# Patient Record
Sex: Male | Born: 1990 | Race: Black or African American | Hispanic: No | Marital: Single | Smoking: Never smoker
Health system: Southern US, Community
[De-identification: ages and names within clinical notes are randomized; demographics above are authoritative.]

## PROBLEM LIST (undated history)

## (undated) DIAGNOSIS — I10 Essential (primary) hypertension: Secondary | ICD-10-CM

## (undated) DIAGNOSIS — E119 Type 2 diabetes mellitus without complications: Secondary | ICD-10-CM

## (undated) DIAGNOSIS — Z21 Asymptomatic human immunodeficiency virus [HIV] infection status: Secondary | ICD-10-CM

## (undated) DIAGNOSIS — B20 Human immunodeficiency virus [HIV] disease: Secondary | ICD-10-CM

---

## 2011-06-25 ENCOUNTER — Encounter (HOSPITAL_COMMUNITY): Payer: Self-pay | Admitting: *Deleted

## 2011-06-25 ENCOUNTER — Emergency Department (HOSPITAL_COMMUNITY): Payer: Medicaid Other

## 2011-06-25 ENCOUNTER — Emergency Department (HOSPITAL_COMMUNITY)
Admission: EM | Admit: 2011-06-25 | Discharge: 2011-06-25 | Disposition: A | Discharge status: Home / Self Care | Payer: Medicaid Other | Attending: Emergency Medicine | Admitting: Emergency Medicine

## 2011-06-25 DIAGNOSIS — Y92009 Unspecified place in unspecified non-institutional (private) residence as the place of occurrence of the external cause: Secondary | ICD-10-CM | POA: Insufficient documentation

## 2011-06-25 DIAGNOSIS — S01511A Laceration without foreign body of lip, initial encounter: Secondary | ICD-10-CM

## 2011-06-25 DIAGNOSIS — S025XXA Fracture of tooth (traumatic), initial encounter for closed fracture: Secondary | ICD-10-CM | POA: Insufficient documentation

## 2011-06-25 DIAGNOSIS — S032XXA Dislocation of tooth, initial encounter: Secondary | ICD-10-CM

## 2011-06-25 DIAGNOSIS — S01501A Unspecified open wound of lip, initial encounter: Secondary | ICD-10-CM | POA: Insufficient documentation

## 2011-06-25 MED ORDER — IBUPROFEN 800 MG PO TABS: 800.0000 mg | ORAL_TABLET | Freq: Three times a day (TID) | ORAL | Status: AC | PRN

## 2011-06-25 MED ORDER — HYDROCODONE-ACETAMINOPHEN 5-325 MG PO TABS: 1.0000 | ORAL_TABLET | Freq: Four times a day (QID) | ORAL | Status: AC | PRN
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
## 2011-06-25 NOTE — Discharge Instructions (Signed)
Follow up with the dentist provided. Return here as needed. Keep wounds clean and dry. Have sutures out in 5 days.

## 2011-06-25 NOTE — ED Notes (Signed)
Suture cart placed outside the room.
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
## 2011-06-28 NOTE — ED Provider Notes (Signed)
Medical screening examination/treatment/procedure(s) were performed by non-physician practitioner and as supervising physician I was immediately available for consultation/collaboration.   Azana Kiesler E Meadow Abramo, MD 06/28/11 1016 

## 2012-06-19 ENCOUNTER — Emergency Department (HOSPITAL_COMMUNITY)
Admission: EM | Admit: 2012-06-19 | Discharge: 2012-06-19 | Disposition: A | Discharge status: Home / Self Care | Payer: BC Managed Care – PPO | Attending: Emergency Medicine | Admitting: Emergency Medicine

## 2012-06-19 ENCOUNTER — Encounter (HOSPITAL_COMMUNITY): Payer: Self-pay | Admitting: *Deleted

## 2012-06-19 DIAGNOSIS — R059 Cough, unspecified: Secondary | ICD-10-CM | POA: Insufficient documentation

## 2012-06-19 DIAGNOSIS — J3489 Other specified disorders of nose and nasal sinuses: Secondary | ICD-10-CM | POA: Insufficient documentation

## 2012-06-19 DIAGNOSIS — R1110 Vomiting, unspecified: Secondary | ICD-10-CM | POA: Insufficient documentation

## 2012-06-19 DIAGNOSIS — R05 Cough: Secondary | ICD-10-CM | POA: Insufficient documentation

## 2012-06-19 DIAGNOSIS — F17200 Nicotine dependence, unspecified, uncomplicated: Secondary | ICD-10-CM | POA: Insufficient documentation

## 2012-06-19 MED ORDER — BENZONATATE 100 MG PO CAPS: 200.0000 mg | ORAL_CAPSULE | Freq: Two times a day (BID) | ORAL | Status: DC | PRN

## 2012-06-19 MED ORDER — FLUTICASONE PROPIONATE 50 MCG/ACT NA SUSP: 2.0000 | Freq: Every day | NASAL | Status: DC

## 2012-06-19 NOTE — ED Notes (Signed)
Pt c/o cough and congestion since Tuesday. Pt states he cough so hard today which made him gag and throw up. Pt took Mucinex with some relief. Respirations equal and unlabored, skin warm and dry, A&Ox4

## 2012-06-19 NOTE — ED Provider Notes (Signed)
History    This chart was scribed for Jimmy Silk, PA working with Vida Roller, MD by ED Scribe, Burman Nieves. This patient was seen in room WTR8/WTR8 and the patient's care was started at 11:29 PM.   CSN: [id number]  Arrival date & time 06/19/12  2315   First MD Initiated Contact with Patient 06/19/12 2329      Chief Complaint  Patient presents with  . Cough    (Consider location/radiation/quality/duration/timing/severity/associated sxs/prior treatment) Patient is a 22 y.o. male presenting with cough. The history is provided by the patient. No language interpreter was used.  Cough Cough characteristics:  Productive Associated symptoms: rhinorrhea   Associated symptoms: no chest pain, no chills and no fever    HPI Comments: Jimmy Fields is a 22 y.o. male who presents to the Emergency Department complaining of moderate constant productive cough which started Monday morning. Pt states that he has vomited twice in the past hour due to his progressive violent cough. Pt states it has gotten worse since yesterday. Pt has ingested tea with honey with no relief and has been taking  hot showers to breath in the steam which has helped a little. Pt states that the cough is worse at night preventing him from sleeping. Pt denies fever, chills, nausea, vomiting, diarrhea, SOB, weakness, and any other associated symptoms. Pt states that he used to get rash's as a kid to [employer] where his skin would start to peel.    History reviewed. No pertinent past medical history.  History reviewed. No pertinent past surgical history.  History reviewed. No pertinent family history.  History  Substance Use Topics  . Smoking status: Current Every Day Smoker  . Smokeless tobacco: Not on file  . Alcohol Use: Yes      Review of Systems  Constitutional: Negative for fever and chills.  HENT: Positive for rhinorrhea.   Respiratory: Positive for cough.   Cardiovascular: Negative for chest pain.  All  other systems reviewed and are negative.    Allergies  Review of patient's allergies indicates no known allergies.  Home Medications   Current Outpatient Rx  Name  Route  Sig  Dispense  Refill  . ibuprofen (ADVIL,MOTRIN) 200 MG tablet   Oral   Take 400-800 mg by mouth every 8 (eight) hours as needed. For pain           BP 152/89  Pulse 110  Temp(Src) 99.8 F (37.7 C) (Oral)  Resp 20  Ht 5\' 10"  (1.778 m)  Wt 276 lb (125.193 kg)  BMI 39.6 kg/m2  SpO2 96%  Physical Exam  Nursing note and vitals reviewed. Constitutional: He is oriented to person, place, and time. He appears well-developed and well-nourished. No distress.  HENT:  Head: Normocephalic and atraumatic.  Right Ear: External ear normal.  Left Ear: External ear normal.  Nose: Nose normal.  swollen turbinates bilaterally in his nares.   Eyes: Conjunctivae are normal.  Neck: Normal range of motion. No tracheal deviation present.  Cardiovascular: Normal rate, regular rhythm and normal heart sounds.   Pulmonary/Chest: Effort normal and breath sounds normal. No stridor.  Abdominal: Soft. He exhibits no distension. There is no tenderness.  Musculoskeletal: Normal range of motion.  Neurological: He is alert and oriented to person, place, and time.  Skin: Skin is warm and dry. He is not diaphoretic.  Psychiatric: He has a normal mood and affect. His behavior is normal.    ED Course  Procedures (including critical care time) DIAGNOSTIC  STUDIES: Oxygen Saturation is 96% on room air, adequate by my interpretation.    COORDINATION OF CARE:  11:37 PM Discussed ED treatment with pt and pt agrees. Prescribing pt with cough syrup and Flonase for sx's.   Labs Reviewed - No data to display No results found.   1. Cough       MDM   Patients symptoms are consistent with URI, likely viral etiology. Lungs CTA, O2 sat 96% on RA. Discussed that antibiotics are not indicated for viral infections. Pt will be discharged  with symptomatic treatment.  Verbalizes understanding and is agreeable with plan. Pt is hemodynamically stable & in NAD prior to dc.      I personally performed the services described in this documentation, which was scribed in my presence. The recorded information has been reviewed and is accurate.      Mora Bellman, PA-C 06/20/12 0030

## 2012-06-20 NOTE — ED Provider Notes (Signed)
Medical screening examination/treatment/procedure(s) were performed by non-physician practitioner and as supervising physician I was immediately available for consultation/collaboration.    Vida Roller, MD 06/20/12 1520

## 2012-09-16 ENCOUNTER — Emergency Department (HOSPITAL_COMMUNITY)
Admission: EM | Admit: 2012-09-16 | Discharge: 2012-09-16 | Disposition: A | Discharge status: Home / Self Care | Payer: No Typology Code available for payment source | Attending: Emergency Medicine | Admitting: Emergency Medicine

## 2012-09-16 ENCOUNTER — Encounter (HOSPITAL_COMMUNITY): Payer: Self-pay | Admitting: Emergency Medicine

## 2012-09-16 DIAGNOSIS — Y92410 Unspecified street and highway as the place of occurrence of the external cause: Secondary | ICD-10-CM | POA: Insufficient documentation

## 2012-09-16 DIAGNOSIS — S46909A Unspecified injury of unspecified muscle, fascia and tendon at shoulder and upper arm level, unspecified arm, initial encounter: Secondary | ICD-10-CM | POA: Insufficient documentation

## 2012-09-16 DIAGNOSIS — S161XXA Strain of muscle, fascia and tendon at neck level, initial encounter: Secondary | ICD-10-CM

## 2012-09-16 DIAGNOSIS — M545 Low back pain, unspecified: Secondary | ICD-10-CM

## 2012-09-16 DIAGNOSIS — S4980XA Other specified injuries of shoulder and upper arm, unspecified arm, initial encounter: Secondary | ICD-10-CM | POA: Insufficient documentation

## 2012-09-16 DIAGNOSIS — S139XXA Sprain of joints and ligaments of unspecified parts of neck, initial encounter: Secondary | ICD-10-CM | POA: Insufficient documentation

## 2012-09-16 DIAGNOSIS — S0993XA Unspecified injury of face, initial encounter: Secondary | ICD-10-CM | POA: Insufficient documentation

## 2012-09-16 DIAGNOSIS — Y9389 Activity, other specified: Secondary | ICD-10-CM | POA: Insufficient documentation

## 2012-09-16 DIAGNOSIS — IMO0002 Reserved for concepts with insufficient information to code with codable children: Secondary | ICD-10-CM | POA: Insufficient documentation

## 2012-09-16 DIAGNOSIS — F17200 Nicotine dependence, unspecified, uncomplicated: Secondary | ICD-10-CM | POA: Insufficient documentation

## 2012-09-16 MED ORDER — HYDROCODONE-ACETAMINOPHEN 5-325 MG PO TABS: 2.0000 | ORAL_TABLET | ORAL | Status: DC | PRN

## 2012-09-16 MED ORDER — CYCLOBENZAPRINE HCL 5 MG PO TABS: 5.0000 mg | ORAL_TABLET | Freq: Three times a day (TID) | ORAL | Status: DC | PRN

## 2012-09-16 MED ORDER — IBUPROFEN 800 MG PO TABS
800.0000 mg | ORAL_TABLET | Freq: Once | ORAL | Status: AC
  Administered 2012-09-16: 800 mg via ORAL
  Filled 2012-09-16: qty 1

## 2012-09-16 MED ORDER — IBUPROFEN 800 MG PO TABS: 800.0000 mg | ORAL_TABLET | Freq: Three times a day (TID) | ORAL | Status: DC | PRN

## 2012-09-16 MED ORDER — CYCLOBENZAPRINE HCL 10 MG PO TABS
5.0000 mg | ORAL_TABLET | Freq: Once | ORAL | Status: AC
  Administered 2012-09-16: 5 mg via ORAL
  Filled 2012-09-16: qty 1
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
## 2012-09-17 NOTE — ED Provider Notes (Signed)
Medical screening examination/treatment/procedure(s) were performed by non-physician practitioner and as supervising physician I was immediately available for consultation/collaboration.   Shanna Cisco, MD 09/17/12 1048

## 2013-10-20 ENCOUNTER — Emergency Department (HOSPITAL_COMMUNITY)
Admission: EM | Admit: 2013-10-20 | Discharge: 2013-10-20 | Disposition: A | Discharge status: Home / Self Care | Payer: BC Managed Care – PPO | Attending: Emergency Medicine | Admitting: Emergency Medicine

## 2013-10-20 ENCOUNTER — Encounter (HOSPITAL_COMMUNITY): Payer: Self-pay | Admitting: Emergency Medicine

## 2013-10-20 DIAGNOSIS — K529 Noninfective gastroenteritis and colitis, unspecified: Secondary | ICD-10-CM

## 2013-10-20 DIAGNOSIS — R197 Diarrhea, unspecified: Secondary | ICD-10-CM

## 2013-10-20 DIAGNOSIS — R109 Unspecified abdominal pain: Secondary | ICD-10-CM | POA: Insufficient documentation

## 2013-10-20 DIAGNOSIS — R112 Nausea with vomiting, unspecified: Secondary | ICD-10-CM

## 2013-10-20 DIAGNOSIS — IMO0002 Reserved for concepts with insufficient information to code with codable children: Secondary | ICD-10-CM | POA: Insufficient documentation

## 2013-10-20 DIAGNOSIS — K5289 Other specified noninfective gastroenteritis and colitis: Secondary | ICD-10-CM | POA: Insufficient documentation

## 2013-10-20 LAB — URINALYSIS, ROUTINE W REFLEX MICROSCOPIC
BILIRUBIN URINE: NEGATIVE
Glucose, UA: NEGATIVE mg/dL
Hgb urine dipstick: NEGATIVE
KETONES UR: NEGATIVE mg/dL
Leukocytes, UA: NEGATIVE
NITRITE: NEGATIVE
PROTEIN: NEGATIVE mg/dL
SPECIFIC GRAVITY, URINE: 1.019 (ref 1.005–1.030)
UROBILINOGEN UA: 0.2 mg/dL (ref 0.0–1.0)
pH: 8 (ref 5.0–8.0)

## 2013-10-20 LAB — COMPREHENSIVE METABOLIC PANEL
ALK PHOS: 76 U/L (ref 39–117)
ALT: 24 U/L (ref 0–53)
AST: 24 U/L (ref 0–37)
Albumin: 4.3 g/dL (ref 3.5–5.2)
Anion gap: 12 (ref 5–15)
BILIRUBIN TOTAL: 0.6 mg/dL (ref 0.3–1.2)
BUN: 9 mg/dL (ref 6–23)
CHLORIDE: 102 meq/L (ref 96–112)
CO2: 25 meq/L (ref 19–32)
CREATININE: 0.82 mg/dL (ref 0.50–1.35)
Calcium: 9.5 mg/dL (ref 8.4–10.5)
GFR calc Af Amer: >90 mL/min (ref >90)
Glucose, Bld: 95 mg/dL (ref 70–99)
Potassium: 4.5 mEq/L (ref 3.7–5.3)
Sodium: 139 mEq/L (ref 137–147)
Total Protein: 7.7 g/dL (ref 6.0–8.3)

## 2013-10-20 LAB — CBC WITH DIFFERENTIAL/PLATELET
BASOS ABS: 0 10*3/uL (ref 0.0–0.1)
BASOS PCT: 0 % (ref 0–1)
EOS ABS: 0.2 10*3/uL (ref 0.0–0.7)
EOS PCT: 2 % (ref 0–5)
HEMATOCRIT: 46.7 % (ref 39.0–52.0)
Hemoglobin: 15.8 g/dL (ref 13.0–17.0)
Lymphocytes Relative: 38 % (ref 12–46)
Lymphs Abs: 3.7 10*3/uL (ref 0.7–4.0)
MCH: 26.6 pg (ref 26.0–34.0)
MCHC: 33.8 g/dL (ref 30.0–36.0)
MCV: 78.5 fL (ref 78.0–100.0)
MONO ABS: 0.8 10*3/uL (ref 0.1–1.0)
Monocytes Relative: 9 % (ref 3–12)
NEUTROS ABS: 4.9 10*3/uL (ref 1.7–7.7)
Neutrophils Relative %: 51 % (ref 43–77)
Platelets: 235 10*3/uL (ref 150–400)
RBC: 5.95 MIL/uL — ABNORMAL HIGH (ref 4.22–5.81)
RDW: 13 % (ref 11.5–15.5)
WBC: 9.7 10*3/uL (ref 4.0–10.5)

## 2013-10-20 LAB — LIPASE, BLOOD: LIPASE: 51 U/L (ref 11–59)

## 2013-10-20 MED ORDER — ACETAMINOPHEN 325 MG PO TABS
650.0000 mg | ORAL_TABLET | Freq: Once | ORAL | Status: AC
  Administered 2013-10-20: 650 mg via ORAL
  Filled 2013-10-20: qty 2

## 2013-10-20 MED ORDER — ONDANSETRON 4 MG PO TBDP
8.0000 mg | ORAL_TABLET | Freq: Once | ORAL | Status: AC
  Administered 2013-10-20: 8 mg via ORAL
  Filled 2013-10-20: qty 2

## 2013-10-20 NOTE — Discharge Instructions (Signed)
It was our pleasure to provide your ER care today - we hope that you feel better.  Rest. Drink plenty of fluids. Follow up with primary care doctor, or here, in 1 days time for recheck if symptoms fail to improve/resolve.  Your blood pressure is high today - follow up with primary care doctor in the next couple weeks for recheck.  Return to ER if worse, new symptoms, fevers, persistent vomiting, return of or worsening abdominal pain, other concern.       Viral Gastroenteritis Viral gastroenteritis is also known as stomach flu. This condition affects the stomach and intestinal tract. It can cause sudden diarrhea and vomiting. The illness typically lasts 3 to 8 days. Most people develop an immune response that eventually gets rid of the virus. While this natural response develops, the virus can make you quite ill. CAUSES  Many different viruses can cause gastroenteritis, such as rotavirus or noroviruses. You can catch one of these viruses by consuming contaminated food or water. You may also catch a virus by sharing utensils or other personal items with an infected person or by touching a contaminated surface. SYMPTOMS  The most common symptoms are diarrhea and vomiting. These problems can cause a severe loss of body fluids (dehydration) and a body salt (electrolyte) imbalance. Other symptoms may include:  Fever.  Headache.  Fatigue.  Abdominal pain. DIAGNOSIS  Your caregiver can usually diagnose viral gastroenteritis based on your symptoms and a physical exam. A stool sample may also be taken to test for the presence of viruses or other infections. TREATMENT  This illness typically goes away on its own. Treatments are aimed at rehydration. The most serious cases of viral gastroenteritis involve vomiting so severely that you are not able to keep fluids down. In these cases, fluids must be given through an intravenous line (IV). HOME CARE INSTRUCTIONS   Drink enough fluids to keep your  urine clear or pale yellow. Drink small amounts of fluids frequently and increase the amounts as tolerated.  Ask your caregiver for specific rehydration instructions.  Avoid:  Foods high in sugar.  Alcohol.  Carbonated drinks.  Tobacco.  Juice.  Caffeine drinks.  Extremely hot or cold fluids.  Fatty, greasy foods.  Too much intake of anything at one time.  Dairy products until 24 to 48 hours after diarrhea stops.  You may consume probiotics. Probiotics are active cultures of beneficial bacteria. They may lessen the amount and number of diarrheal stools in adults. Probiotics can be found in yogurt with active cultures and in supplements.  Wash your hands well to avoid spreading the virus.  Only take over-the-counter or prescription medicines for pain, discomfort, or fever as directed by your caregiver. Do not give aspirin to children. Antidiarrheal medicines are not recommended.  Ask your caregiver if you should continue to take your regular prescribed and over-the-counter medicines.  Keep all follow-up appointments as directed by your caregiver. SEEK IMMEDIATE MEDICAL CARE IF:   You are unable to keep fluids down.  You do not urinate at least once every 6 to 8 hours.  You develop shortness of breath.  You notice blood in your stool or vomit. This may look like coffee grounds.  You have abdominal pain that increases or is concentrated in one small area (localized).  You have persistent vomiting or diarrhea.  You have a fever.  The patient is a child younger than 3 months, and he or she has a fever.  The patient is a child  older than 3 months, and he or she has a fever and persistent symptoms.  The patient is a child older than 3 months, and he or she has a fever and symptoms suddenly get worse.  The patient is a baby, and he or she has no tears when crying. MAKE SURE YOU:   Understand these instructions.  Will watch your condition.  Will get help right  away if you are not doing well or get worse. Document Released: 01/15/2005 Document Revised: 04/09/2011 Document Reviewed: 11/01/2010 [facility] Patient Information 2015 [place], [place]. This information is not intended to replace advice given to you by your health care provider. Make sure you discuss any questions you have with your health care provider.   Nausea and Vomiting Nausea is a sick feeling that often comes before throwing up (vomiting). Vomiting is a reflex where stomach contents come out of your mouth. Vomiting can cause severe loss of body fluids (dehydration). Children and elderly adults can become dehydrated quickly, especially if they also have diarrhea. Nausea and vomiting are symptoms of a condition or disease. It is important to find the cause of your symptoms. CAUSES   Direct irritation of the stomach lining. This irritation can result from increased acid production (gastroesophageal reflux disease), infection, food poisoning, taking certain medicines (such as nonsteroidal anti-inflammatory drugs), alcohol use, or tobacco use.  Signals from the brain.These signals could be caused by a headache, heat exposure, an inner ear disturbance, increased pressure in the brain from injury, infection, a tumor, or a concussion, pain, emotional stimulus, or metabolic problems.  An obstruction in the gastrointestinal tract (bowel obstruction).  Illnesses such as diabetes, hepatitis, gallbladder problems, appendicitis, kidney problems, cancer, sepsis, atypical symptoms of a heart attack, or eating disorders.  Medical treatments such as chemotherapy and radiation.  Receiving medicine that makes you sleep (general anesthetic) during surgery. DIAGNOSIS Your caregiver may ask for tests to be done if the problems do not improve after a few days. Tests may also be done if symptoms are severe or if the reason for the nausea and vomiting is not clear. Tests may include:  Urine tests.  Blood  tests.  Stool tests.  Cultures (to look for evidence of infection).  X-rays or other imaging studies. Test results can help your caregiver make decisions about treatment or the need for additional tests. TREATMENT You need to stay well hydrated. Drink frequently but in small amounts.You may wish to drink water, sports drinks, clear broth, or eat frozen ice pops or gelatin dessert to help stay hydrated.When you eat, eating slowly may help prevent nausea.There are also some antinausea medicines that may help prevent nausea. HOME CARE INSTRUCTIONS   Take all medicine as directed by your caregiver.  If you do not have an appetite, do not force yourself to eat. However, you must continue to drink fluids.  If you have an appetite, eat a normal diet unless your caregiver tells you differently.  Eat a variety of complex carbohydrates (rice, wheat, potatoes, bread), lean meats, yogurt, fruits, and vegetables.  Avoid high-fat foods because they are more difficult to digest.  Drink enough water and fluids to keep your urine clear or pale yellow.  If you are dehydrated, ask your caregiver for specific rehydration instructions. Signs of dehydration may include:  Severe thirst.  Dry lips and mouth.  Dizziness.  Dark urine.  Decreasing urine frequency and amount.  Confusion.  Rapid breathing or pulse. SEEK IMMEDIATE MEDICAL CARE IF:   You have blood  or brown flecks (like coffee grounds) in your vomit.  You have black or bloody stools.  You have a severe headache or stiff neck.  You are confused.  You have severe abdominal pain.  You have chest pain or trouble breathing.  You do not urinate at least once every 8 hours.  You develop cold or clammy skin.  You continue to vomit for longer than 24 to 48 hours.  You have a fever. MAKE SURE YOU:   Understand these instructions.  Will watch your condition.  Will get help right away if you are not doing well or get  worse. Document Released: 01/15/2005 Document Revised: 04/09/2011 Document Reviewed: 06/14/2010 [facility] Patient Information 2015 [place], [place]. This information is not intended to replace advice given to you by your health care provider. Make sure you discuss any questions you have with your health care provider.    Diarrhea Diarrhea is frequent loose and watery bowel movements. It can cause you to feel weak and dehydrated. Dehydration can cause you to become tired and thirsty, have a dry mouth, and have decreased urination that often is dark yellow. Diarrhea is a sign of another problem, most often an infection that will not last long. In most cases, diarrhea typically lasts 2-3 days. However, it can last longer if it is a sign of something more serious. It is important to treat your diarrhea as directed by your caregiver to lessen or prevent future episodes of diarrhea. CAUSES  Some common causes include:  Gastrointestinal infections caused by viruses, bacteria, or parasites.  Food poisoning or food allergies.  Certain medicines, such as antibiotics, chemotherapy, and laxatives.  Artificial sweeteners and fructose.  Digestive disorders. HOME CARE INSTRUCTIONS  Ensure adequate fluid intake (hydration): Have 1 cup (8 oz) of fluid for each diarrhea episode. Avoid fluids that contain simple sugars or sports drinks, fruit juices, whole milk products, and sodas. Your urine should be clear or pale yellow if you are drinking enough fluids. Hydrate with an oral rehydration solution that you can purchase at pharmacies, retail stores, and online. You can prepare an oral rehydration solution at home by mixing the following ingredients together:   - tsp table salt.   tsp baking soda.   tsp salt substitute containing potassium chloride.  1  tablespoons sugar.  1 L (34 oz) of water.  Certain foods and beverages may increase the speed at which food moves through the gastrointestinal (GI)  tract. These foods and beverages should be avoided and include:  Caffeinated and alcoholic beverages.  High-fiber foods, such as raw fruits and vegetables, nuts, seeds, and whole grain breads and cereals.  Foods and beverages sweetened with sugar alcohols, such as xylitol, sorbitol, and mannitol.  Some foods may be well tolerated and may help thicken stool including:  Starchy foods, such as rice, toast, pasta, low-sugar cereal, oatmeal, grits, baked potatoes, crackers, and bagels.  Bananas.  Applesauce.  Add probiotic-rich foods to help increase healthy bacteria in the GI tract, such as yogurt and fermented milk products.  Wash your hands well after each diarrhea episode.  Only take over-the-counter or prescription medicines as directed by your caregiver.  Take a warm bath to relieve any burning or pain from frequent diarrhea episodes. SEEK IMMEDIATE MEDICAL CARE IF:   You are unable to keep fluids down.  You have persistent vomiting.  You have blood in your stool, or your stools are black and tarry.  You do not urinate in 6-8 hours, or there is  only a small amount of very dark urine.  You have abdominal pain that increases or localizes.  You have weakness, dizziness, confusion, or light-headedness.  You have a severe headache.  Your diarrhea gets worse or does not get better.  You have a fever or persistent symptoms for more than 2-3 days.  You have a fever and your symptoms suddenly get worse. MAKE SURE YOU:   Understand these instructions.  Will watch your condition.  Will get help right away if you are not doing well or get worse. Document Released: 01/05/2002 Document Revised: 06/01/2013 Document Reviewed: 09/23/2011 [facility] Patient Information 2015 [place], [place]. This information is not intended to replace advice given to you by your health care provider. Make sure you discuss any questions you have with your health care  provider.    Hypertension Hypertension, commonly called high blood pressure, is when the force of blood pumping through your arteries is too strong. Your arteries are the blood vessels that carry blood from your heart throughout your body. A blood pressure reading consists of a higher number over a lower number, such as 110/72. The higher number (systolic) is the pressure inside your arteries when your heart pumps. The lower number (diastolic) is the pressure inside your arteries when your heart relaxes. Ideally you want your blood pressure below 120/80. Hypertension forces your heart to work harder to pump blood. Your arteries may become narrow or stiff. Having hypertension puts you at risk for heart disease, stroke, and other problems.  RISK FACTORS Some risk factors for high blood pressure are controllable. Others are not.  Risk factors you cannot control include:   Race. You may be at higher risk if you are African American.  Age. Risk increases with age.  Gender. Men are at higher risk than women before age 1 years. After age 62, women are at higher risk than men. Risk factors you can control include:  Not getting enough exercise or physical activity.  Being overweight.  Getting too much fat, sugar, calories, or salt in your diet.  Drinking too much alcohol. SIGNS AND SYMPTOMS Hypertension does not usually cause signs or symptoms. Extremely high blood pressure (hypertensive crisis) may cause headache, anxiety, shortness of breath, and nosebleed. DIAGNOSIS  To check if you have hypertension, your health care provider will measure your blood pressure while you are seated, with your arm held at the level of your heart. It should be measured at least twice using the same arm. Certain conditions can cause a difference in blood pressure between your right and left arms. A blood pressure reading that is higher than normal on one occasion does not mean that you need treatment. If one blood  pressure reading is high, ask your health care provider about having it checked again. TREATMENT  Treating high blood pressure includes making lifestyle changes and possibly taking medicine. Living a healthy lifestyle can help lower high blood pressure. You may need to change some of your habits. Lifestyle changes may include:  Following the DASH diet. This diet is high in fruits, vegetables, and whole grains. It is low in salt, red meat, and added sugars.  Getting at least 2 hours of brisk physical activity every week.  Losing weight if necessary.  Not smoking.  Limiting alcoholic beverages.  Learning ways to reduce stress. If lifestyle changes are not enough to get your blood pressure under control, your health care provider may prescribe medicine. You may need to take more than one. Work closely with  your health care provider to understand the risks and benefits. HOME CARE INSTRUCTIONS  Have your blood pressure rechecked as directed by your health care provider.   Take medicines only as directed by your health care provider. Follow the directions carefully. Blood pressure medicines must be taken as prescribed. The medicine does not work as well when you skip doses. Skipping doses also puts you at risk for problems.   Do not smoke.   Monitor your blood pressure at home as directed by your health care provider. SEEK MEDICAL CARE IF:   You think you are having a reaction to medicines taken.  You have recurrent headaches or feel dizzy.  You have swelling in your ankles.  You have trouble with your vision. SEEK IMMEDIATE MEDICAL CARE IF:  You develop a severe headache or confusion.  You have unusual weakness, numbness, or feel faint.  You have severe chest or abdominal pain.  You vomit repeatedly.  You have trouble breathing. MAKE SURE YOU:   Understand these instructions.  Will watch your condition.  Will get help right away if you are not doing well or get  worse. Document Released: 01/15/2005 Document Revised: 06/01/2013 Document Reviewed: 11/07/2012 [facility] Patient Information 2015 [place], [place]. This information is not intended to replace advice given to you by your health care provider. Make sure you discuss any questions you have with your health care provider.

## 2013-10-20 NOTE — ED Notes (Signed)
Pt reports lower abdominal pain since AM with N/V/D. Pt denies fever/chills. Pt in NAD. AO x4.

## 2013-10-20 NOTE — ED Notes (Signed)
Pt provided with Sprite

## 2013-10-20 NOTE — ED Provider Notes (Signed)
CSN: [id number]     Arrival date & time 10/20/13  1308 History   First MD Initiated Contact with Patient 10/20/13 1542     Chief Complaint  Patient presents with  . Abdominal Pain     (Consider location/radiation/quality/duration/timing/severity/associated sxs/prior Treatment) Patient is a 23 y.o. male presenting with abdominal pain. The history is provided by the patient.  Abdominal Pain Associated symptoms: diarrhea and vomiting   Associated symptoms: no chest pain, no cough, no dysuria, no fever, no shortness of breath and no sore throat   pt c/o nvd onset this am, with intermittent, mid/diffuse abd crampy discomfort. No constant, focal or unilateral abd pain. No definite known ill contacts. No known bad food ingestion or recent abx use. No travel. No prior abd surgery.  Denies fever or chills. 4-5 episodes diarrhea, watery, not bloody. Couple episodes emesis, not bloody or bilious.  Normal appetite/hungry. No dysuria, hematuria or gu c/o.     History reviewed. No pertinent past medical history. History reviewed. No pertinent past surgical history. No family history on file. History  Substance Use Topics  . Smoking status: Never Smoker   . Smokeless tobacco: Not on file  . Alcohol Use: Yes    Review of Systems  Constitutional: Negative for fever.  HENT: Negative for sore throat.   Eyes: Negative for redness.  Respiratory: Negative for cough and shortness of breath.   Cardiovascular: Negative for chest pain.  Gastrointestinal: Positive for vomiting, abdominal pain and diarrhea.  Endocrine: Negative for polyuria.  Genitourinary: Negative for dysuria and flank pain.  Musculoskeletal: Negative for back pain and neck pain.  Skin: Negative for rash.  Neurological: Negative for headaches.  Hematological: Does not bruise/bleed easily.  Psychiatric/Behavioral: Negative for confusion.      Allergies  Review of patient's allergies indicates no known allergies.  Home  Medications   Prior to Admission medications   Medication Sig Start Date End Date Taking? Authorizing Provider  benzonatate (TESSALON) 100 MG capsule Take 2 capsules (200 mg total) by mouth 2 (two) times daily as needed for cough. 06/19/12   Mora Bellman, PA-C  cyclobenzaprine (FLEXERIL) 5 MG tablet Take 1 tablet (5 mg total) by mouth 3 (three) times daily as needed for muscle spasms (use regularly for 3 days than as needed). 09/16/12   Arman Filter, NP  fluticasone (FLONASE) 50 MCG/ACT nasal spray Place 2 sprays into the nose daily. 06/19/12   Mora Bellman, PA-C  HYDROcodone-acetaminophen (NORCO/VICODIN) 5-325 MG per tablet Take 2 tablets by mouth every 4 (four) hours as needed for pain. 09/16/12   Arman Filter, NP  ibuprofen (ADVIL,MOTRIN) 200 MG tablet Take 400-800 mg by mouth every 8 (eight) hours as needed. For pain    Historical Provider, MD  ibuprofen (ADVIL,MOTRIN) 800 MG tablet Take 1 tablet (800 mg total) by mouth every 8 (eight) hours as needed for pain. 09/16/12   Arman Filter, NP   BP 149/97  Pulse 60  Temp(Src) 97.8 F (36.6 C) (Oral)  Resp 20  Ht  (1.803 m)  Wt 250 lb (113.399 kg)  BMI 34.88 kg/m2  SpO2 100% Physical Exam  Nursing note and vitals reviewed. Constitutional: He is oriented to person, place, and time. He appears well-developed and well-nourished. No distress.  HENT:  Head: Atraumatic.  Mouth/Throat: Oropharynx is clear and moist.  Eyes: Conjunctivae are normal. No scleral icterus.  Neck: Neck supple. No tracheal deviation present.  No stiffness or rigidity  Cardiovascular: Normal rate,  regular rhythm, normal heart sounds and intact distal pulses.   Pulmonary/Chest: Effort normal and breath sounds normal. No accessory muscle usage. No respiratory distress.  Abdominal: Soft. Bowel sounds are normal. He exhibits no distension and no mass. There is no tenderness. There is no rebound and no guarding.  No hernia.   Genitourinary:  No cva tenderness.    Musculoskeletal: Normal range of motion. He exhibits no edema and no tenderness.  Neurological: He is alert and oriented to person, place, and time.  Skin: Skin is warm and dry. He is not diaphoretic.  Psychiatric: He has a normal mood and affect.    ED Course  Procedures (including critical care time) Labs Review   Results for orders placed during the hospital encounter of 10/20/13  COMPREHENSIVE METABOLIC PANEL      Result Value Ref Range   Sodium 139  137 - 147 mEq/L   Potassium 4.5  3.7 - 5.3 mEq/L   Chloride 102  96 - 112 mEq/L   CO2 25  19 - 32 mEq/L   Glucose, Bld 95  70 - 99 mg/dL   BUN 9  6 - 23 mg/dL   Creatinine, Ser 4.03  0.50 - 1.35 mg/dL   Calcium 9.5  8.4 - 47.4 mg/dL   Total Protein 7.7  6.0 - 8.3 g/dL   Albumin 4.3  3.5 - 5.2 g/dL   AST 24  0 - 37 U/L   ALT 24  0 - 53 U/L   Alkaline Phosphatase 76  39 - 117 U/L   Total Bilirubin 0.6  0.3 - 1.2 mg/dL   GFR calc non Af Amer >90  >90 mL/min   GFR calc Af Amer >90  >90 mL/min   Anion gap 12  5 - 15  LIPASE, BLOOD      Result Value Ref Range   Lipase 51  11 - 59 U/L  CBC WITH DIFFERENTIAL      Result Value Ref Range   WBC 9.7  4.0 - 10.5 K/uL   RBC 5.95 (*) 4.22 - 5.81 MIL/uL   Hemoglobin 15.8  13.0 - 17.0 g/dL   HCT 25.9  56.3 - 87.5 %   MCV 78.5  78.0 - 100.0 fL   MCH 26.6  26.0 - 34.0 pg   MCHC 33.8  30.0 - 36.0 g/dL   RDW 64.3  32.9 - 51.8 %   Platelets 235  150 - 400 K/uL   Neutrophils Relative % 51  43 - 77 %   Neutro Abs 4.9  1.7 - 7.7 K/uL   Lymphocytes Relative 38  12 - 46 %   Lymphs Abs 3.7  0.7 - 4.0 K/uL   Monocytes Relative 9  3 - 12 %   Monocytes Absolute 0.8  0.1 - 1.0 K/uL   Eosinophils Relative 2  0 - 5 %   Eosinophils Absolute 0.2  0.0 - 0.7 K/uL   Basophils Relative 0  0 - 1 %   Basophils Absolute 0.0  0.0 - 0.1 K/uL       MDM  zofran po.  Po fluids.  Reviewed nursing notes and prior charts for additional history.   Recheck no recurrent nvd.  Pain resolved. abd soft  nt. Tolerating po fluids well.  Pt requests d/c home, states he feels much better.      Suzi Roots, MD 10/20/13 [phone]

## 2014-08-11 ENCOUNTER — Encounter (HOSPITAL_COMMUNITY): Payer: Self-pay | Admitting: Emergency Medicine

## 2014-08-11 ENCOUNTER — Emergency Department (HOSPITAL_COMMUNITY)
Admission: EM | Admit: 2014-08-11 | Discharge: 2014-08-11 | Disposition: A | Discharge status: Home / Self Care | Payer: Medicaid Other | Attending: Emergency Medicine | Admitting: Emergency Medicine

## 2014-08-11 DIAGNOSIS — R030 Elevated blood-pressure reading, without diagnosis of hypertension: Secondary | ICD-10-CM

## 2014-08-11 DIAGNOSIS — R51 Headache: Secondary | ICD-10-CM | POA: Insufficient documentation

## 2014-08-11 DIAGNOSIS — Z720 Tobacco use: Secondary | ICD-10-CM | POA: Insufficient documentation

## 2014-08-11 DIAGNOSIS — J0110 Acute frontal sinusitis, unspecified: Secondary | ICD-10-CM | POA: Insufficient documentation

## 2014-08-11 DIAGNOSIS — Z7982 Long term (current) use of aspirin: Secondary | ICD-10-CM | POA: Insufficient documentation

## 2014-08-11 LAB — CBC WITH DIFFERENTIAL/PLATELET
BAND NEUTROPHILS: 4 % (ref 0–10)
BASOS ABS: 0 10*3/uL (ref 0.0–0.1)
BLASTS: 0 %
Basophils Relative: 0 % (ref 0–1)
EOS ABS: 0.6 10*3/uL (ref 0.0–0.7)
Eosinophils Relative: 4 % (ref 0–5)
HCT: 46.6 % (ref 39.0–52.0)
Hemoglobin: 16.2 g/dL (ref 13.0–17.0)
LYMPHS PCT: 29 % (ref 12–46)
Lymphs Abs: 4.1 10*3/uL — ABNORMAL HIGH (ref 0.7–4.0)
MCH: 27.6 pg (ref 26.0–34.0)
MCHC: 34.8 g/dL (ref 30.0–36.0)
MCV: 79.4 fL (ref 78.0–100.0)
MYELOCYTES: 0 %
Metamyelocytes Relative: 0 %
Monocytes Absolute: 1.1 10*3/uL — ABNORMAL HIGH (ref 0.1–1.0)
Monocytes Relative: 8 % (ref 3–12)
NEUTROS ABS: 8.5 10*3/uL — AB (ref 1.7–7.7)
NRBC: 0 /100{WBCs}
Neutrophils Relative %: 55 % (ref 43–77)
OTHER: 0 %
PROMYELOCYTES ABS: 0 %
Platelets: 269 10*3/uL (ref 150–400)
RBC: 5.87 MIL/uL — AB (ref 4.22–5.81)
RDW: 13.6 % (ref 11.5–15.5)
WBC: 14.3 10*3/uL — AB (ref 4.0–10.5)

## 2014-08-11 LAB — BASIC METABOLIC PANEL
ANION GAP: 7 (ref 5–15)
BUN: 10 mg/dL (ref 6–20)
CHLORIDE: 104 mmol/L (ref 101–111)
CO2: 28 mmol/L (ref 22–32)
Calcium: 9.3 mg/dL (ref 8.9–10.3)
Creatinine, Ser: 0.89 mg/dL (ref 0.61–1.24)
GFR calc Af Amer: >60 mL/min (ref >60)
GLUCOSE: 84 mg/dL (ref 65–99)
POTASSIUM: 3.9 mmol/L (ref 3.5–5.1)
SODIUM: 139 mmol/L (ref 135–145)

## 2014-08-11 MED ORDER — KETOROLAC TROMETHAMINE 60 MG/2ML IM SOLN
60.0000 mg | Freq: Once | INTRAMUSCULAR | Status: AC
Start: 2014-08-11 — End: 2014-08-11
  Administered 2014-08-11: 60 mg via INTRAMUSCULAR
  Filled 2014-08-11: qty 2

## 2014-08-11 MED ORDER — AMOXICILLIN 500 MG PO CAPS: 500.0000 mg | ORAL_CAPSULE | Freq: Three times a day (TID) | ORAL | Status: DC

## 2014-08-11 NOTE — ED Notes (Signed)
C/o nasal congestion since yesterday and mild non-productive cough.  Also reports headache since this morning.  States he went to pharmacy to get sinus medication and they checked his BP and it was 192/127.  They told him to come straight to ED.  Reports frontal headache at this time.

## 2014-08-11 NOTE — ED Provider Notes (Signed)
CSN: [id number]     Arrival date & time 08/11/14  1544 History   First MD Initiated Contact with Patient 08/11/14 1828     Chief Complaint  Patient presents with  . Hypertension  . Facial Pain     (Consider location/radiation/quality/duration/timing/severity/associated sxs/prior Treatment) Patient is a 24 y.o. male presenting with hypertension. The history is provided by the patient and medical records.  Hypertension Associated symptoms include congestion and headaches.    This is a 24 year old male with no significant past medical history presenting to the ED for headache, cough, and nasal congestion which began yesterday afternoon. Patient states symptoms seem worse upon waking this morning. He went to the pharmacy to pick up some over-the-counter cold medication and was told to check his blood pressure by the pharmacist which he did. Blood pressure was elevated at that time and encouraged to come to the ED for further evaluation. Patient has no known history of hypertension, states everyone in his immediate family has hypertension.  He continues to have a frontal headache, described as aching in nature. He denies any dizziness, tinnitus, changes in speech, numbness or weakness of extremities, visual disturbance, or confusion. No fever, chills, or neck pain. Patient is not currently on any type of anticoagulation. He did take aspirin earlier this morning for headache without relief.  Patient remains hypertensive on arrival.  History reviewed. No pertinent past medical history. History reviewed. No pertinent past surgical history. No family history on file. History  Substance Use Topics  . Smoking status: Current Every Day Smoker    Types: Cigarettes  . Smokeless tobacco: Not on file  . Alcohol Use: Yes    Review of Systems  HENT: Positive for congestion and sinus pressure.   Neurological: Positive for headaches.  All other systems reviewed and are negative.     Allergies   Review of patient's allergies indicates no known allergies.  Home Medications   Prior to Admission medications   Medication Sig Start Date End Date Taking? Authorizing Provider  aspirin EC 81 MG tablet Take 162 mg by mouth every 6 (six) hours as needed for moderate pain.   Yes Historical Provider, MD   BP 153/79 mmHg  Pulse 91  Temp(Src) 97.6 F (36.4 C) (Oral)  Resp 16  Ht  (1.778 m)  Wt 311 lb 1.6 oz (141.114 kg)  BMI 44.64 kg/m2  SpO2 99%   Physical Exam  Constitutional: He is oriented to person, place, and time. He appears well-developed and well-nourished.  Morbidly obese  HENT:  Head: Normocephalic and atraumatic.  Right Ear: Tympanic membrane and ear canal normal.  Left Ear: Tympanic membrane and ear canal normal.  Nose: Mucosal edema present. Right sinus exhibits maxillary sinus tenderness and frontal sinus tenderness. Left sinus exhibits maxillary sinus tenderness and frontal sinus tenderness.  Mouth/Throat: Uvula is midline, oropharynx is clear and moist and mucous membranes are normal. No oropharyngeal exudate, posterior oropharyngeal edema, posterior oropharyngeal erythema or tonsillar abscesses.  Frontal and maxillary sinus pressure noted  Eyes: Conjunctivae and EOM are normal. Pupils are equal, round, and reactive to light.  Neck: Normal range of motion and full passive range of motion without pain. Neck supple. No spinous process tenderness and no muscular tenderness present. No rigidity.  Full range of motion, no meningismus  Cardiovascular: Normal rate, regular rhythm and normal heart sounds.   Pulmonary/Chest: Effort normal and breath sounds normal. No respiratory distress. He has no wheezes.  Abdominal: Soft. Bowel sounds are normal. There  is no tenderness. There is no guarding.  Musculoskeletal: Normal range of motion.  Neurological: He is alert and oriented to person, place, and time.  AAOx3, answering questions appropriately; equal strength UE and LE  bilaterally; CN grossly intact; moves all extremities appropriately without ataxia; no focal neuro deficits or facial asymmetry appreciated  Skin: Skin is warm and dry.  Psychiatric: He has a normal mood and affect.  Nursing note and vitals reviewed.   ED Course  Procedures (including critical care time) Labs Review Labs Reviewed  CBC WITH DIFFERENTIAL/PLATELET - Abnormal; Notable for the following:    WBC 14.3 (*)    RBC 5.87 (*)    Neutro Abs 8.5 (*)    Lymphs Abs 4.1 (*)    Monocytes Absolute 1.1 (*)    All other components within normal limits  BASIC METABOLIC PANEL    Imaging Review No results found.   EKG Interpretation None      MDM   Final diagnoses:  Elevated blood pressure reading  Acute frontal sinusitis, recurrence not specified   24 year old male here with likely frontal sinusitis and elevated blood pressure. He has no known history of hypertension, strong family history. Patient is in no acute distress. Neurologic exam is nonfocal. No clinical signs of meningitis.  Low suspicion for acute intracranial pathology at this time including TIA, CVA, SAH, ICH, meningitis.  Basic lab work was obtained which is reassuring. Toradol given with resolution of headache. After medications given and headache resolved, blood pressure has returned to normal. Patients last four BP readings have been steadily improving, now nearly in normal range at 154/84.  Suspicion that BP elevation related to headache/acute pain.  Nonetheless, patient has strong family hx of HTN and will need close FU-- referred to wellness clinic.  Will start on amoxicillin for sinusitis.  Discussed plan with patient, he/she acknowledged understanding and agreed with plan of care.  Return precautions given for new or worsening symptoms.  Garlon HatchetLisa M Sanders, PA-C 08/11/14 2136  Bethann BerkshireJoseph Zammit, MD 08/11/14 2220

## 2014-08-11 NOTE — Discharge Instructions (Signed)
Take the prescribed medication as directed. [facility] for re-check of blood pressure. Return to the ED for new or worsening symptoms.

## 2015-04-27 ENCOUNTER — Ambulatory Visit: Payer: Medicaid Other | Admitting: Infectious Disease

## 2017-10-24 ENCOUNTER — Other Ambulatory Visit: Payer: Self-pay

## 2017-10-24 ENCOUNTER — Observation Stay (HOSPITAL_COMMUNITY)
Admission: EM | Admit: 2017-10-24 | Discharge: 2017-10-25 | Discharge status: Against Medical Advice | Payer: Self-pay | Attending: General Surgery | Admitting: General Surgery

## 2017-10-24 ENCOUNTER — Encounter (HOSPITAL_COMMUNITY): Payer: Self-pay

## 2017-10-24 ENCOUNTER — Emergency Department (HOSPITAL_COMMUNITY): Payer: Self-pay

## 2017-10-24 DIAGNOSIS — Z21 Asymptomatic human immunodeficiency virus [HIV] infection status: Secondary | ICD-10-CM | POA: Insufficient documentation

## 2017-10-24 DIAGNOSIS — Z79899 Other long term (current) drug therapy: Secondary | ICD-10-CM | POA: Insufficient documentation

## 2017-10-24 DIAGNOSIS — Z87891 Personal history of nicotine dependence: Secondary | ICD-10-CM | POA: Insufficient documentation

## 2017-10-24 DIAGNOSIS — K760 Fatty (change of) liver, not elsewhere classified: Secondary | ICD-10-CM | POA: Insufficient documentation

## 2017-10-24 DIAGNOSIS — K3580 Unspecified acute appendicitis: Secondary | ICD-10-CM | POA: Diagnosis present

## 2017-10-24 DIAGNOSIS — I10 Essential (primary) hypertension: Secondary | ICD-10-CM | POA: Insufficient documentation

## 2017-10-24 DIAGNOSIS — K37 Unspecified appendicitis: Secondary | ICD-10-CM | POA: Insufficient documentation

## 2017-10-24 DIAGNOSIS — Z8249 Family history of ischemic heart disease and other diseases of the circulatory system: Secondary | ICD-10-CM | POA: Insufficient documentation

## 2017-10-24 DIAGNOSIS — Z7982 Long term (current) use of aspirin: Secondary | ICD-10-CM | POA: Insufficient documentation

## 2017-10-24 DIAGNOSIS — R109 Unspecified abdominal pain: Principal | ICD-10-CM | POA: Insufficient documentation

## 2017-10-24 HISTORY — DX: Human immunodeficiency virus (HIV) disease: B20

## 2017-10-24 HISTORY — DX: Asymptomatic human immunodeficiency virus (hiv) infection status: Z21

## 2017-10-24 HISTORY — DX: Essential (primary) hypertension: I10

## 2017-10-24 LAB — COMPREHENSIVE METABOLIC PANEL
ALBUMIN: 3.9 g/dL (ref 3.5–5.0)
ALT: 30 U/L (ref 0–44)
ANION GAP: 10 (ref 5–15)
AST: 29 U/L (ref 15–41)
Alkaline Phosphatase: 58 U/L (ref 38–126)
BILIRUBIN TOTAL: 0.7 mg/dL (ref 0.3–1.2)
BUN: 6 mg/dL (ref 6–20)
CALCIUM: 8.9 mg/dL (ref 8.9–10.3)
CO2: 24 mmol/L (ref 22–32)
Chloride: 105 mmol/L (ref 98–111)
Creatinine, Ser: 0.9 mg/dL (ref 0.61–1.24)
GFR calc non Af Amer: >60 mL/min (ref >60)
GLUCOSE: 150 mg/dL — AB (ref 70–99)
POTASSIUM: 3.9 mmol/L (ref 3.5–5.1)
Sodium: 139 mmol/L (ref 135–145)
Total Protein: 7.6 g/dL (ref 6.5–8.1)

## 2017-10-24 LAB — CBC
HCT: 46.2 % (ref 39.0–52.0)
HEMOGLOBIN: 15.3 g/dL (ref 13.0–17.0)
MCH: 26.7 pg (ref 26.0–34.0)
MCHC: 33.1 g/dL (ref 30.0–36.0)
MCV: 80.8 fL (ref 78.0–100.0)
Platelets: 279 10*3/uL (ref 150–400)
RBC: 5.72 MIL/uL (ref 4.22–5.81)
RDW: 14.3 % (ref 11.5–15.5)
WBC: 14.5 10*3/uL — AB (ref 4.0–10.5)

## 2017-10-24 LAB — URINALYSIS, ROUTINE W REFLEX MICROSCOPIC
Bacteria, UA: NONE SEEN
Bilirubin Urine: NEGATIVE
GLUCOSE, UA: NEGATIVE mg/dL
Hgb urine dipstick: NEGATIVE
Ketones, ur: NEGATIVE mg/dL
LEUKOCYTES UA: NEGATIVE
NITRITE: NEGATIVE
PH: 8 (ref 5.0–8.0)
Protein, ur: 100 mg/dL — AB
Specific Gravity, Urine: 1.021 (ref 1.005–1.030)

## 2017-10-24 LAB — LIPASE, BLOOD: Lipase: 61 U/L — ABNORMAL HIGH (ref 11–51)

## 2017-10-24 MED ORDER — IOPAMIDOL (ISOVUE-300) INJECTION 61%
100.0000 mL | Freq: Once | INTRAVENOUS | Status: AC | PRN
  Administered 2017-10-24: 100 mL via INTRAVENOUS

## 2017-10-24 MED ORDER — IOPAMIDOL (ISOVUE-300) INJECTION 61%
INTRAVENOUS | Status: AC
  Filled 2017-10-24: qty 100

## 2017-10-24 MED ORDER — PIPERACILLIN-TAZOBACTAM 3.375 G IVPB 30 MIN
3.3750 g | Freq: Once | INTRAVENOUS | Status: AC
  Administered 2017-10-24: 3.375 g via INTRAVENOUS
  Filled 2017-10-24: qty 50

## 2017-10-24 NOTE — ED Notes (Signed)
Pt assisted with bathroom

## 2017-10-24 NOTE — ED Provider Notes (Addendum)
[name] Provider Note   CSN: [id number] Arrival date & time: 10/24/17  1417     History   Chief Complaint Chief Complaint  Patient presents with  . Abdominal Pain    HPI Jimmy Fields is a 27 y.o. male.  HPI Patient presents with abdominal pain.  Woke with this morning.  Diffuse over the abdomen but may be more severe in the upper abdomen.  States he feels if he has to have a bowel movement but has not been able to.  Has had nausea and some dry heaves.  No dysuria.  No fevers.  Pain is dull.  States she has been scared to eat.  Has not had pains like this before.  He is HIV positive and is on treatment.  States he does not know what his CD4 count is but his viral load is undetectable.  Patient states he will occasionally drink alcohol but has not drank since this weekend. Past Medical History:  Diagnosis Date  . HIV (human immunodeficiency virus infection) ([facility])   . Hypertension     There are no active problems to display for this patient.   History reviewed. No pertinent surgical history.      Home Medications    Prior to Admission medications   Medication Sig Start Date End Date Taking? Authorizing Provider  amLODipine (NORVASC) 5 MG tablet Take 5 mg by mouth daily.  10/04/17  Yes [provider]  aspirin EC 81 MG tablet Take 81 mg by mouth daily.    Yes [provider]  BIKTARVY 50-200-25 MG TABS tablet Take 1 tablet by mouth daily.  09/29/17  Yes [provider]  cholecalciferol (VITAMIN D) 1000 units tablet Take 1,000 Units by mouth daily.   Yes [provider]  GARLIC PO Take 1 capsule by mouth daily.   Yes [provider]  Multiple Vitamins-Minerals (MULTIVITAMIN ADULT PO) Take 1 tablet by mouth daily.   Yes [provider]  Omega-3 Fatty Acids (FISH OIL) 1000 MG CAPS Take 1 capsule by mouth daily.   Yes [provider]  ondansetron (ZOFRAN) 8 MG tablet Take 8 mg by  mouth every 8 (eight) hours as needed for nausea or vomiting.  10/04/17  Yes [provider]  amoxicillin (AMOXIL) 500 MG capsule Take 1 capsule (500 mg total) by mouth 3 (three) times daily. Patient not taking: Reported on 10/24/2017 08/11/14   Garlon Hatchet, PA-C    Family History Family History  Problem Relation Age of Onset  . Heart failure Father     Social History Social History   Tobacco Use  . Smoking status: Former Smoker    Types: Cigarettes  . Smokeless tobacco: Never Used  Substance Use Topics  . Alcohol use: Yes  . Drug use: Yes    Frequency: 14.0 times per week    Types: Marijuana    Comment: 3-4 times a week     Allergies   Patient has no known allergies.   Review of Systems Review of Systems  Constitutional: Positive for appetite change.  HENT: Negative for congestion.   Respiratory: Negative for shortness of breath.   Cardiovascular: Negative for chest pain.  Gastrointestinal: Positive for abdominal pain, constipation and nausea. Negative for vomiting.  Genitourinary: Negative for frequency.  Musculoskeletal: Negative for back pain.  Skin: Negative for rash.  Neurological: Negative for weakness.  Psychiatric/Behavioral: Negative for confusion.     Physical Exam Updated Vital Signs BP (!) 172/96 (  BP Location: Left Arm)   Pulse 77   Temp (!) 97.5 F (36.4 C) (Oral)   Resp 18   Ht 5\' 10"  (1.778 m)   Wt (!) 156.8 kg   SpO2 100%   BMI 49.60 kg/m   Physical Exam  Constitutional: He appears well-developed.  HENT:  Head: Normocephalic.  Eyes: EOM are normal.  Cardiovascular: Regular rhythm.  Abdominal: No hernia.  Somewhat mild diffuse tenderness but worse in the upper abdomen.  Neurological: He is alert.  Skin: Skin is warm. Capillary refill takes less than 2 seconds.     ED Treatments / Results  Labs (all labs ordered are listed, but only abnormal results are displayed) Labs Reviewed  LIPASE, BLOOD - Abnormal; Notable for  the following components:      Result Value   Lipase 61 (*)    All other components within normal limits  COMPREHENSIVE METABOLIC PANEL - Abnormal; Notable for the following components:   Glucose, Bld 150 (*)    All other components within normal limits  CBC - Abnormal; Notable for the following components:   WBC 14.5 (*)    All other components within normal limits  URINALYSIS, ROUTINE W REFLEX MICROSCOPIC - Abnormal; Notable for the following components:   Protein, ur 100 (*)    All other components within normal limits    EKG None  Radiology Ct Abdomen Pelvis W Contrast  Result Date: 10/24/2017 CLINICAL DATA:  Diffuse abdomen pain EXAM: CT ABDOMEN AND PELVIS WITH CONTRAST TECHNIQUE: Multidetector CT imaging of the abdomen and pelvis was performed using the standard protocol following bolus administration of intravenous contrast. CONTRAST:  ISOVUE-300 IOPAMIDOL (ISOVUE-300) INJECTION 61% COMPARISON:  None. FINDINGS: Lower chest: There is a calcified granuloma in the anterior right lung base. The lungs are otherwise clear. The heart size is normal. Hepatobiliary: There is diffuse low density of the liver without focal liver lesion. The gallbladder and biliary tree are normal. Pancreas: Unremarkable. No pancreatic ductal dilatation or surrounding inflammatory changes. Spleen: Normal in size without focal abnormality. Adrenals/Urinary Tract: Adrenal glands are unremarkable. Kidneys are normal, without renal calculi, focal lesion, or hydronephrosis. Bladder is unremarkable. Stomach/Bowel: Inseparable from the normal portion of the appendix is a blind ending structure with mild surrounding inflammation measuring 1.5 cm in diameter. Appendicitis it the distal tip of the appendix is not excluded. The small bowel and colon are normal. The stomach is normal. Vascular/Lymphatic: No significant vascular findings are present. No enlarged abdominal or pelvic lymph nodes. Reproductive: Prostate is  unremarkable. Other: None. Musculoskeletal: No acute or significant osseous findings. IMPRESSION: Inseparable from the normal portion of the appendix is a blind ending structure with mild surrounding inflammation measuring 1.5 cm in diameter. Appendicitis it the distal tip of the appendix is not excluded. Fatty infiltration of liver. Electronically Signed   By: Sherian Rein M.D.   On: 10/24/2017 19:27    Procedures Procedures (including critical care time)  Medications Ordered in ED Medications  iopamidol (ISOVUE-300) 61 % injection (has no administration in time range)  iopamidol (ISOVUE-300) 61 % injection 100 mL (100 mLs Intravenous Contrast Given 10/24/17 1855)  piperacillin-tazobactam (ZOSYN) IVPB 3.375 g (0 g Intravenous Stopped 10/24/17 2155)     Initial Impression / Assessment and Plan / ED Course  I have reviewed the triage vital signs and the nursing notes.  Pertinent labs & imaging results that were available during my care of the patient were reviewed by me and considered in my medical decision  making (see chart for details).     Patient with abdominal pain.  Began this morning.  All over.  Has had nausea.  Decreased appetite.  States feels if he needs to have a bowel movement.  HIV but reported normal viral load.  White count is mildly elevated.  CT scan showed possible distal appendicitis.  However pain is somewhat diffuse.  Will discuss with general surgery.  Patient had a bowel movement later and felt much better.  Abdominal exam improved without tenderness.  However with CT scan showing possible appendicitis general surgery is still going to admit overnight.  Final Clinical Impressions(s) / ED Diagnoses   Final diagnoses:  Abdominal pain, unspecified abdominal location    ED Discharge Orders    None       Benjiman Core, MD 10/24/17 Willette Alma    Benjiman Core, MD 10/24/17 2227

## 2017-10-24 NOTE — ED Triage Notes (Signed)
Patient reports that he woke up with "all over" abdominal pain that radiates into the mid back. Patient states he has been nauseated and feels like he needs to have a B,but can not. Patient states he had a normal BM last night.

## 2017-10-24 NOTE — ED Notes (Signed)
Surgeon at bedside.  

## 2017-10-24 NOTE — H&P (Signed)
Jimmy Fields is an 27 y.o. male.   Chief Complaint: abdominal pain HPI: The patient is a 27 year old black male who presents with abdominal pain that woke him up from sleep early this morning.  The pain at that time was centered near the umbilicus.  The pain was associated with some nausea but no vomiting.  The pain persisted throughout the day until he came to the emergency department for further evaluation.  His CT scan did show some inflammatory change around what is believed to be the tip of the appendix but no evidence of perforation.  His white count was elevated at 14,000.  He does have a history of HIV.  Past Medical History:  Diagnosis Date  . HIV (human immunodeficiency virus infection) ([facility])   . Hypertension     History reviewed. No pertinent surgical history.  Family History  Problem Relation Age of Onset  . Heart failure Father    Social History:  reports that he has quit smoking. His smoking use included cigarettes. He has never used smokeless tobacco. He reports that he drinks alcohol. He reports that he has current or past drug history. Drug: Marijuana. Frequency: 14.00 times per week.  Allergies: No Known Allergies   (Not in a hospital admission)  Results for orders placed or performed during the hospital encounter of 10/24/17 (from the past 48 hour(s))  Lipase, blood     Status: Abnormal   Collection Time: 10/24/17  2:39 PM  Result Value Ref Range   Lipase 61 (H) 11 - 51 U/L    Comment: Performed at [facility], [place] [address], [place], [place] [address]  Comprehensive metabolic panel     Status: Abnormal   Collection Time: 10/24/17  2:39 PM  Result Value Ref Range   Sodium 139 135 - 145 mmol/L   Potassium 3.9 3.5 - 5.1 mmol/L   Chloride 105 98 - 111 mmol/L   CO2 24 22 - 32 mmol/L   Glucose, Bld 150 (H) 70 - 99 mg/dL   BUN 6 6 - 20 mg/dL   Creatinine, Ser 0.90 0.61 - 1.24 mg/dL   Calcium 8.9 8.9 - 10.3 mg/dL   Total Protein 7.6 6.5 - 8.1  g/dL   Albumin 3.9 3.5 - 5.0 g/dL   AST 29 15 - 41 U/L   ALT 30 0 - 44 U/L   Alkaline Phosphatase 58 38 - 126 U/L   Total Bilirubin 0.7 0.3 - 1.2 mg/dL   GFR calc non Af Amer >60 >60 mL/min   GFR calc Af Amer >60 >60 mL/min    Comment: (NOTE) The eGFR has been calculated using the CKD EPI equation. This calculation has not been validated in all clinical situations. eGFR's persistently <60 mL/min signify possible Chronic Kidney Disease.    Anion gap 10 5 - 15    Comment: Performed at [facility], [place] [address] [place], [place] [address]  CBC     Status: Abnormal   Collection Time: 10/24/17  2:39 PM  Result Value Ref Range   WBC 14.5 (H) 4.0 - 10.5 K/uL   RBC 5.72 4.22 - 5.81 MIL/uL   Hemoglobin 15.3 13.0 - 17.0 g/dL   HCT 46.2 39.0 - 52.0 %   MCV 80.8 78.0 - 100.0 fL   MCH 26.7 26.0 - 34.0 pg   MCHC 33.1 30.0 - 36.0 g/dL   RDW 14.3 11.5 - 15.5 %   Platelets 279 150 - 400 K/uL  Comment: Performed at [facility], [place] [address], [place], [place] [address]  Urinalysis, Routine w reflex microscopic     Status: Abnormal   Collection Time: 10/24/17  6:24 PM  Result Value Ref Range   Color, Urine YELLOW YELLOW   APPearance CLEAR CLEAR   Specific Gravity, Urine 1.021 1.005 - 1.030   pH 8.0 5.0 - 8.0   Glucose, UA NEGATIVE NEGATIVE mg/dL   Hgb urine dipstick NEGATIVE NEGATIVE   Bilirubin Urine NEGATIVE NEGATIVE   Ketones, ur NEGATIVE NEGATIVE mg/dL   Protein, ur 100 (A) NEGATIVE mg/dL   Nitrite NEGATIVE NEGATIVE   Leukocytes, UA NEGATIVE NEGATIVE   RBC / HPF 0-5 0 - 5 RBC/hpf   WBC, UA 0-5 0 - 5 WBC/hpf   Bacteria, UA NONE SEEN NONE SEEN   Mucus PRESENT     Comment: Performed at [facility], [place] [address], [place],  [address]   Ct Abdomen Pelvis W Contrast  Result Date: 10/24/2017 CLINICAL DATA:  Diffuse abdomen pain EXAM: CT ABDOMEN AND PELVIS WITH CONTRAST TECHNIQUE: Multidetector CT imaging of the  abdomen and pelvis was performed using the standard protocol following bolus administration of intravenous contrast. CONTRAST:  120m ISOVUE-300 IOPAMIDOL (ISOVUE-300) INJECTION 61% COMPARISON:  None. FINDINGS: Lower chest: There is a calcified granuloma in the anterior right lung base. The lungs are otherwise clear. The heart size is normal. Hepatobiliary: There is diffuse low density of the liver without focal liver lesion. The gallbladder and biliary tree are normal. Pancreas: Unremarkable. No pancreatic ductal dilatation or surrounding inflammatory changes. Spleen: Normal in size without focal abnormality. Adrenals/Urinary Tract: Adrenal glands are unremarkable. Kidneys are normal, without renal calculi, focal lesion, or hydronephrosis. Bladder is unremarkable. Stomach/Bowel: Inseparable from the normal portion of the appendix is a blind ending structure with mild surrounding inflammation measuring 1.5 cm in diameter. Appendicitis it the distal tip of the appendix is not excluded. The small bowel and colon are normal. The stomach is normal. Vascular/Lymphatic: No significant vascular findings are present. No enlarged abdominal or pelvic lymph nodes. Reproductive: Prostate is unremarkable. Other: None. Musculoskeletal: No acute or significant osseous findings. IMPRESSION: Inseparable from the normal portion of the appendix is a blind ending structure with mild surrounding inflammation measuring 1.5 cm in diameter. Appendicitis it the distal tip of the appendix is not excluded. Fatty infiltration of liver. Electronically Signed   By: WAbelardo DieselM.D.   On: 10/24/2017 19:27    Review of Systems  Constitutional: Negative.   HENT: Negative.   Eyes: Negative.   Respiratory: Negative.   Cardiovascular: Negative.   Gastrointestinal: Positive for abdominal pain and nausea.  Genitourinary: Negative.   Musculoskeletal: Positive for back pain.  Skin: Negative.   Neurological: Negative.   Endo/Heme/Allergies:  Negative.   Psychiatric/Behavioral: Negative.     Blood pressure (!) 172/96, pulse 77, temperature (!) 97.5 F (36.4 C), temperature source Oral, resp. rate 18, height 5' 10"  (1.778 m), weight (!) 156.8 kg, SpO2 100 %. Physical Exam  Constitutional: He is oriented to person, place, and time. He appears well-developed and well-nourished. No distress.  HENT:  Head: Normocephalic and atraumatic.  Mouth/Throat: No oropharyngeal exudate.  Eyes: Pupils are equal, round, and reactive to light. Conjunctivae and EOM are normal.  Neck: Normal range of motion. Neck supple.  Cardiovascular: Normal rate, regular rhythm and normal heart sounds.  Respiratory: Effort normal and breath sounds normal. No stridor. No respiratory distress.  GI: Soft. Bowel sounds are normal.  There is no tenderness.  Musculoskeletal: Normal range of motion. He exhibits no edema or tenderness.  Neurological: He is alert and oriented to person, place, and time. Coordination normal.  Skin: Skin is warm and dry. No rash noted.  Psychiatric: He has a normal mood and affect. His behavior is normal. Thought content normal.     Assessment/Plan The patient appears to have a very early tip appendicitis.  At this point I would recommend that he stay in the hospital and be started on IV antibiotics.  We will recheck his white count in the morning.  After having a bowel movement this evening in the ER he has no abdominal pain at this point.  We will reevaluate him in the morning and decide whether he should undergo appendectomy or not.  Autumn Messing III, MD 10/24/2017, 10:09 PM

## 2017-10-25 DIAGNOSIS — K3580 Unspecified acute appendicitis: Secondary | ICD-10-CM | POA: Diagnosis present

## 2017-10-25 NOTE — ED Notes (Signed)
Spoke with Carolynne Edouard, MD and he is aware that pt is wanting to leave AMA

## 2017-10-25 NOTE — ED Notes (Signed)
Pt states that he was feeling better and wants to call his doctor in the morning and talk to him about it. Pt states that all of his insurance goes through [facility] and he would have a bill from here and there he would not.

## 2017-10-25 NOTE — ED Notes (Addendum)
Pt stated that he wants to leave and does not want the surgery because "he feels better and if it gets worse than he will just come back." Contacted [facility] to notify Carolynne Edouard, MD

## 2017-10-31 ENCOUNTER — Emergency Department (HOSPITAL_COMMUNITY): Payer: Self-pay

## 2017-10-31 ENCOUNTER — Encounter (HOSPITAL_COMMUNITY)
Admission: EM | Disposition: A | Discharge status: Home / Self Care | Payer: Self-pay | Source: Home / Self Care | Attending: Emergency Medicine

## 2017-10-31 ENCOUNTER — Encounter (HOSPITAL_COMMUNITY): Payer: Self-pay | Admitting: Emergency Medicine

## 2017-10-31 ENCOUNTER — Observation Stay (HOSPITAL_COMMUNITY): Payer: Self-pay | Admitting: Anesthesiology

## 2017-10-31 ENCOUNTER — Observation Stay (HOSPITAL_COMMUNITY)
Admission: EM | Admit: 2017-10-31 | Discharge: 2017-11-01 | Disposition: A | Discharge status: Home / Self Care | Payer: Self-pay | Attending: General Surgery | Admitting: General Surgery

## 2017-10-31 ENCOUNTER — Other Ambulatory Visit: Payer: Self-pay

## 2017-10-31 DIAGNOSIS — K3589 Other acute appendicitis without perforation or gangrene: Secondary | ICD-10-CM

## 2017-10-31 DIAGNOSIS — Z7982 Long term (current) use of aspirin: Secondary | ICD-10-CM | POA: Insufficient documentation

## 2017-10-31 DIAGNOSIS — I10 Essential (primary) hypertension: Secondary | ICD-10-CM | POA: Insufficient documentation

## 2017-10-31 DIAGNOSIS — Z21 Asymptomatic human immunodeficiency virus [HIV] infection status: Secondary | ICD-10-CM | POA: Insufficient documentation

## 2017-10-31 DIAGNOSIS — K4090 Unilateral inguinal hernia, without obstruction or gangrene, not specified as recurrent: Secondary | ICD-10-CM | POA: Insufficient documentation

## 2017-10-31 DIAGNOSIS — B20 Human immunodeficiency virus [HIV] disease: Secondary | ICD-10-CM | POA: Diagnosis present

## 2017-10-31 DIAGNOSIS — K5900 Constipation, unspecified: Secondary | ICD-10-CM | POA: Insufficient documentation

## 2017-10-31 DIAGNOSIS — K3580 Unspecified acute appendicitis: Principal | ICD-10-CM | POA: Insufficient documentation

## 2017-10-31 DIAGNOSIS — A539 Syphilis, unspecified: Secondary | ICD-10-CM | POA: Insufficient documentation

## 2017-10-31 DIAGNOSIS — Z6841 Body Mass Index (BMI) 40.0 and over, adult: Secondary | ICD-10-CM | POA: Insufficient documentation

## 2017-10-31 DIAGNOSIS — K760 Fatty (change of) liver, not elsewhere classified: Secondary | ICD-10-CM | POA: Insufficient documentation

## 2017-10-31 DIAGNOSIS — Z87891 Personal history of nicotine dependence: Secondary | ICD-10-CM | POA: Insufficient documentation

## 2017-10-31 DIAGNOSIS — Z8249 Family history of ischemic heart disease and other diseases of the circulatory system: Secondary | ICD-10-CM | POA: Insufficient documentation

## 2017-10-31 HISTORY — PX: LAPAROSCOPIC APPENDECTOMY: SHX408

## 2017-10-31 LAB — CBC WITH DIFFERENTIAL/PLATELET
Basophils Absolute: 0 10*3/uL (ref 0.0–0.1)
Basophils Relative: 0 %
Eosinophils Absolute: 0.3 10*3/uL (ref 0.0–0.7)
Eosinophils Relative: 3 %
HEMATOCRIT: 47.6 % (ref 39.0–52.0)
Hemoglobin: 15.4 g/dL (ref 13.0–17.0)
Lymphocytes Relative: 27 %
Lymphs Abs: 3.3 10*3/uL (ref 0.7–4.0)
MCH: 26.3 pg (ref 26.0–34.0)
MCHC: 32.4 g/dL (ref 30.0–36.0)
MCV: 81.2 fL (ref 78.0–100.0)
MONO ABS: 1.3 10*3/uL — AB (ref 0.1–1.0)
MONOS PCT: 10 %
NEUTROS PCT: 60 %
Neutro Abs: 7.4 10*3/uL (ref 1.7–7.7)
Platelets: 276 10*3/uL (ref 150–400)
RBC: 5.86 MIL/uL — ABNORMAL HIGH (ref 4.22–5.81)
RDW: 14.5 % (ref 11.5–15.5)
WBC: 12.3 10*3/uL — AB (ref 4.0–10.5)

## 2017-10-31 LAB — COMPREHENSIVE METABOLIC PANEL
ALT: 88 U/L — AB (ref 0–44)
AST: 40 U/L (ref 15–41)
Albumin: 4 g/dL (ref 3.5–5.0)
Alkaline Phosphatase: 75 U/L (ref 38–126)
Anion gap: 13 (ref 5–15)
BILIRUBIN TOTAL: 0.6 mg/dL (ref 0.3–1.2)
BUN: 9 mg/dL (ref 6–20)
CO2: 27 mmol/L (ref 22–32)
CREATININE: 0.96 mg/dL (ref 0.61–1.24)
Calcium: 9 mg/dL (ref 8.9–10.3)
Chloride: 101 mmol/L (ref 98–111)
GFR calc Af Amer: >60 mL/min (ref >60)
Glucose, Bld: 137 mg/dL — ABNORMAL HIGH (ref 70–99)
POTASSIUM: 3.6 mmol/L (ref 3.5–5.1)
Sodium: 141 mmol/L (ref 135–145)
TOTAL PROTEIN: 7.5 g/dL (ref 6.5–8.1)

## 2017-10-31 LAB — LIPASE, BLOOD: LIPASE: 34 U/L (ref 11–51)

## 2017-10-31 SURGERY — APPENDECTOMY, LAPAROSCOPIC
Anesthesia: General

## 2017-10-31 MED ORDER — HYDROMORPHONE HCL 1 MG/ML IJ SOLN
0.2500 mg | INTRAMUSCULAR | Status: DC | PRN
Start: 2017-10-31 — End: 2017-10-31

## 2017-10-31 MED ORDER — IOPAMIDOL (ISOVUE-300) INJECTION 61%
INTRAVENOUS | Status: AC
  Filled 2017-10-31: qty 100

## 2017-10-31 MED ORDER — SUGAMMADEX SODIUM 200 MG/2ML IV SOLN
INTRAVENOUS | Status: DC | PRN
  Administered 2017-10-31: 400 mg via INTRAVENOUS

## 2017-10-31 MED ORDER — MORPHINE SULFATE (PF) 4 MG/ML IV SOLN
4.0000 mg | Freq: Once | INTRAVENOUS | Status: AC
  Administered 2017-10-31: 4 mg via INTRAVENOUS
  Filled 2017-10-31: qty 1

## 2017-10-31 MED ORDER — DIPHENHYDRAMINE HCL 50 MG/ML IJ SOLN: 25.0000 mg | Freq: Four times a day (QID) | INTRAMUSCULAR | Status: DC | PRN

## 2017-10-31 MED ORDER — SODIUM CHLORIDE 0.9 % IJ SOLN
INTRAMUSCULAR | Status: AC
  Filled 2017-10-31: qty 50

## 2017-10-31 MED ORDER — LACTATED RINGERS IV SOLN
INTRAVENOUS | Status: DC
  Administered 2017-10-31 (×2): via INTRAVENOUS

## 2017-10-31 MED ORDER — MIDAZOLAM HCL 2 MG/2ML IJ SOLN
INTRAMUSCULAR | Status: AC
  Filled 2017-10-31: qty 2

## 2017-10-31 MED ORDER — MAGIC MOUTHWASH W/LIDOCAINE
15.0000 mL | Freq: Four times a day (QID) | ORAL | Status: DC
  Administered 2017-11-01: 15 mL via ORAL
  Administered 2017-11-01: 5 mL via ORAL
  Filled 2017-10-31 (×4): qty 15

## 2017-10-31 MED ORDER — SODIUM CHLORIDE 0.9 % IV SOLN
INTRAVENOUS | Status: DC
  Administered 2017-10-31: 13:00:00 via INTRAVENOUS

## 2017-10-31 MED ORDER — GABAPENTIN 300 MG PO CAPS
600.0000 mg | ORAL_CAPSULE | Freq: Two times a day (BID) | ORAL | Status: DC
  Administered 2017-10-31 – 2017-11-01 (×2): 600 mg via ORAL
  Filled 2017-10-31 (×2): qty 2

## 2017-10-31 MED ORDER — PIPERACILLIN-TAZOBACTAM 3.375 G IVPB
3.3750 g | Freq: Three times a day (TID) | INTRAVENOUS | Status: DC
  Administered 2017-10-31: 3.375 g via INTRAVENOUS
  Filled 2017-10-31 (×3): qty 50

## 2017-10-31 MED ORDER — IOPAMIDOL (ISOVUE-300) INJECTION 61%
100.0000 mL | Freq: Once | INTRAVENOUS | Status: AC | PRN
  Administered 2017-10-31: 100 mL via INTRAVENOUS

## 2017-10-31 MED ORDER — SODIUM CHLORIDE 0.9 % IV SOLN
INTRAVENOUS | Status: DC
  Administered 2017-10-31: 20:00:00 via INTRAVENOUS

## 2017-10-31 MED ORDER — BUPIVACAINE-EPINEPHRINE (PF) 0.5% -1:200000 IJ SOLN
INTRAMUSCULAR | Status: AC
  Filled 2017-10-31: qty 30

## 2017-10-31 MED ORDER — MIDAZOLAM HCL 2 MG/2ML IJ SOLN
INTRAMUSCULAR | Status: DC | PRN
  Administered 2017-10-31: 2 mg via INTRAVENOUS

## 2017-10-31 MED ORDER — DIPHENHYDRAMINE HCL 25 MG PO CAPS: 25.0000 mg | ORAL_CAPSULE | Freq: Four times a day (QID) | ORAL | Status: DC | PRN

## 2017-10-31 MED ORDER — ONDANSETRON HCL 4 MG/2ML IJ SOLN
INTRAMUSCULAR | Status: DC | PRN
  Administered 2017-10-31: 4 mg via INTRAVENOUS

## 2017-10-31 MED ORDER — BUPIVACAINE-EPINEPHRINE 0.25% -1:200000 IJ SOLN
INTRAMUSCULAR | Status: DC | PRN
  Administered 2017-10-31: 30 mL

## 2017-10-31 MED ORDER — SUCCINYLCHOLINE CHLORIDE 200 MG/10ML IV SOSY
PREFILLED_SYRINGE | INTRAVENOUS | Status: AC
  Filled 2017-10-31: qty 30

## 2017-10-31 MED ORDER — ONDANSETRON HCL 4 MG/2ML IJ SOLN
4.0000 mg | Freq: Four times a day (QID) | INTRAMUSCULAR | Status: DC | PRN
  Administered 2017-10-31 – 2017-11-01 (×2): 4 mg via INTRAVENOUS
  Filled 2017-10-31 (×2): qty 2

## 2017-10-31 MED ORDER — DEXAMETHASONE SODIUM PHOSPHATE 10 MG/ML IJ SOLN
INTRAMUSCULAR | Status: DC | PRN
  Administered 2017-10-31: 10 mg via INTRAVENOUS

## 2017-10-31 MED ORDER — PROPOFOL 10 MG/ML IV BOLUS
INTRAVENOUS | Status: DC | PRN
  Administered 2017-10-31: 280 mg via INTRAVENOUS

## 2017-10-31 MED ORDER — ONDANSETRON 4 MG PO TBDP: 4.0000 mg | ORAL_TABLET | Freq: Four times a day (QID) | ORAL | Status: DC | PRN

## 2017-10-31 MED ORDER — FENTANYL CITRATE (PF) 250 MCG/5ML IJ SOLN
INTRAMUSCULAR | Status: DC | PRN
  Administered 2017-10-31: 150 ug via INTRAVENOUS
  Administered 2017-10-31: 50 ug via INTRAVENOUS

## 2017-10-31 MED ORDER — ROCURONIUM BROMIDE 10 MG/ML (PF) SYRINGE
PREFILLED_SYRINGE | INTRAVENOUS | Status: DC | PRN
  Administered 2017-10-31: 30 mg via INTRAVENOUS
  Administered 2017-10-31: 5 mg via INTRAVENOUS

## 2017-10-31 MED ORDER — AMLODIPINE BESYLATE 5 MG PO TABS
5.0000 mg | ORAL_TABLET | Freq: Every day | ORAL | Status: DC
  Administered 2017-11-01: 5 mg via ORAL
  Filled 2017-10-31: qty 1

## 2017-10-31 MED ORDER — ASPIRIN EC 81 MG PO TBEC
81.0000 mg | DELAYED_RELEASE_TABLET | Freq: Every day | ORAL | Status: DC
  Administered 2017-11-01: 81 mg via ORAL
  Filled 2017-10-31: qty 1

## 2017-10-31 MED ORDER — MORPHINE SULFATE (PF) 2 MG/ML IV SOLN
2.0000 mg | INTRAVENOUS | Status: DC | PRN
  Administered 2017-10-31: 2 mg via INTRAVENOUS
  Administered 2017-10-31 – 2017-11-01 (×3): 4 mg via INTRAVENOUS
  Filled 2017-10-31: qty 1
  Filled 2017-10-31 (×3): qty 2

## 2017-10-31 MED ORDER — ONDANSETRON HCL 4 MG PO TABS: 8.0000 mg | ORAL_TABLET | Freq: Every day | ORAL | Status: DC

## 2017-10-31 MED ORDER — PROPOFOL 10 MG/ML IV BOLUS
INTRAVENOUS | Status: AC
  Filled 2017-10-31: qty 40

## 2017-10-31 MED ORDER — ROCURONIUM BROMIDE 10 MG/ML (PF) SYRINGE
PREFILLED_SYRINGE | INTRAVENOUS | Status: AC
  Filled 2017-10-31: qty 10

## 2017-10-31 MED ORDER — SUCCINYLCHOLINE CHLORIDE 200 MG/10ML IV SOSY
PREFILLED_SYRINGE | INTRAVENOUS | Status: DC | PRN
  Administered 2017-10-31: 160 mg via INTRAVENOUS

## 2017-10-31 MED ORDER — CELECOXIB 200 MG PO CAPS
400.0000 mg | ORAL_CAPSULE | Freq: Two times a day (BID) | ORAL | Status: DC
  Administered 2017-10-31 – 2017-11-01 (×2): 400 mg via ORAL
  Filled 2017-10-31 (×2): qty 2

## 2017-10-31 MED ORDER — KETOROLAC TROMETHAMINE 30 MG/ML IJ SOLN: 30.0000 mg | Freq: Once | INTRAMUSCULAR | Status: DC | PRN

## 2017-10-31 MED ORDER — ONDANSETRON HCL 4 MG/2ML IJ SOLN
4.0000 mg | Freq: Once | INTRAMUSCULAR | Status: AC
  Administered 2017-10-31: 4 mg via INTRAVENOUS
  Filled 2017-10-31: qty 2

## 2017-10-31 MED ORDER — PROMETHAZINE HCL 25 MG/ML IJ SOLN: 6.2500 mg | INTRAMUSCULAR | Status: DC | PRN

## 2017-10-31 MED ORDER — OXYCODONE HCL 5 MG PO TABS
10.0000 mg | ORAL_TABLET | ORAL | Status: DC | PRN
  Administered 2017-10-31 – 2017-11-01 (×2): 10 mg via ORAL
  Filled 2017-10-31 (×2): qty 2

## 2017-10-31 MED ORDER — FENTANYL CITRATE (PF) 250 MCG/5ML IJ SOLN
INTRAMUSCULAR | Status: AC
  Filled 2017-10-31: qty 5

## 2017-10-31 MED ORDER — PHENOL 1.4 % MT LIQD
1.0000 | OROMUCOSAL | Status: DC | PRN
  Filled 2017-10-31: qty 177

## 2017-10-31 MED ORDER — BICTEGRAVIR-EMTRICITAB-TENOFOV 50-200-25 MG PO TABS
1.0000 | ORAL_TABLET | Freq: Every day | ORAL | Status: DC
  Administered 2017-11-01: 1 via ORAL
  Filled 2017-10-31: qty 1

## 2017-10-31 MED ORDER — SODIUM CHLORIDE 0.9 % IV BOLUS
1000.0000 mL | Freq: Once | INTRAVENOUS | Status: AC
  Administered 2017-10-31: 1000 mL via INTRAVENOUS

## 2017-10-31 MED ORDER — PIPERACILLIN-TAZOBACTAM 3.375 G IVPB
3.3750 g | Freq: Three times a day (TID) | INTRAVENOUS | Status: AC
  Administered 2017-10-31: 3.375 g via INTRAVENOUS
  Filled 2017-10-31: qty 50

## 2017-10-31 MED ORDER — LIDOCAINE 2% (20 MG/ML) 5 ML SYRINGE
INTRAMUSCULAR | Status: DC | PRN
  Administered 2017-10-31: 100 mg via INTRAVENOUS

## 2017-10-31 MED ORDER — LIDOCAINE 2% (20 MG/ML) 5 ML SYRINGE
INTRAMUSCULAR | Status: AC
  Filled 2017-10-31: qty 5

## 2017-10-31 SURGICAL SUPPLY — 37 items
APPLIER CLIP 5 13 M/L LIGAMAX5 (MISCELLANEOUS)
APPLIER CLIP ROT 10 11.4 M/L (STAPLE)
CABLE HIGH FREQUENCY MONO STRZ (ELECTRODE) ×2 IMPLANT
CHLORAPREP W/TINT 26ML (MISCELLANEOUS) ×2 IMPLANT
CLIP APPLIE 5 13 M/L LIGAMAX5 (MISCELLANEOUS) IMPLANT
CLIP APPLIE ROT 10 11.4 M/L (STAPLE) IMPLANT
COVER SURGICAL LIGHT HANDLE (MISCELLANEOUS) ×2 IMPLANT
CUTTER FLEX LINEAR 45M (STAPLE) ×2 IMPLANT
DECANTER SPIKE VIAL GLASS SM (MISCELLANEOUS) ×2 IMPLANT
DERMABOND ADVANCED (GAUZE/BANDAGES/DRESSINGS) ×1
DERMABOND ADVANCED .7 DNX12 (GAUZE/BANDAGES/DRESSINGS) ×1 IMPLANT
DRAPE LAPAROSCOPIC ABDOMINAL (DRAPES) ×2 IMPLANT
ELECT REM PT RETURN 15FT ADLT (MISCELLANEOUS) ×2 IMPLANT
GLOVE BIOGEL PI IND STRL 7.5 (GLOVE) ×1 IMPLANT
GLOVE BIOGEL PI INDICATOR 7.5 (GLOVE) ×1
GLOVE ECLIPSE 7.5 STRL STRAW (GLOVE) ×2 IMPLANT
GOWN STRL REUS W/TWL XL LVL3 (GOWN DISPOSABLE) ×4 IMPLANT
GRASPER SUT TROCAR 14GX15 (MISCELLANEOUS) ×2 IMPLANT
KIT BASIN OR (CUSTOM PROCEDURE TRAY) ×2 IMPLANT
PAD POSITIONING PINK XL (MISCELLANEOUS) ×2 IMPLANT
POUCH RETRIEVAL ECOSAC 10 (ENDOMECHANICALS) ×1 IMPLANT
POUCH RETRIEVAL ECOSAC 10MM (ENDOMECHANICALS) ×1
POUCH SPECIMEN RETRIEVAL 10MM (ENDOMECHANICALS) IMPLANT
RELOAD 45 VASCULAR/THIN (ENDOMECHANICALS) IMPLANT
RELOAD STAPLE TA45 3.5 REG BLU (ENDOMECHANICALS) ×2 IMPLANT
SCISSORS LAP 5X35 DISP (ENDOMECHANICALS) ×2 IMPLANT
SET IRRIG TUBING LAPAROSCOPIC (IRRIGATION / IRRIGATOR) ×2 IMPLANT
SHEARS HARMONIC ACE PLUS 36CM (ENDOMECHANICALS) ×2 IMPLANT
SLEEVE XCEL OPT CAN 5 100 (ENDOMECHANICALS) ×2 IMPLANT
SUT MNCRL AB 4-0 PS2 18 (SUTURE) ×2 IMPLANT
TOWEL OR 17X26 10 PK STRL BLUE (TOWEL DISPOSABLE) ×2 IMPLANT
TRAY FOLEY MTR SLVR 16FR STAT (SET/KITS/TRAYS/PACK) ×2 IMPLANT
TRAY LAPAROSCOPIC (CUSTOM PROCEDURE TRAY) ×2 IMPLANT
TROCAR BLADELESS OPT 5 100 (ENDOMECHANICALS) ×2 IMPLANT
TROCAR XCEL 12X100 BLDLESS (ENDOMECHANICALS) ×2 IMPLANT
TROCAR XCEL BLUNT TIP 100MML (ENDOMECHANICALS) ×2 IMPLANT
TUBING INSUF HEATED (TUBING) ×2 IMPLANT

## 2017-10-31 NOTE — Progress Notes (Signed)
Patient received from PACU, patient sleepy but arouses, VS stable. Will continue to monitor.

## 2017-10-31 NOTE — ED Triage Notes (Signed)
Pt c/o ongoing nausea, vomiting and mid abdominal pain that radiates to the RLQ and the back. Pt was eval 10/24/2017, CT showed possible appendicitis but pt left AMA and returns today with worsening symptoms.

## 2017-10-31 NOTE — Anesthesia Preprocedure Evaluation (Addendum)
Anesthesia Evaluation  Patient identified by MRN, date of birth, ID band Patient awake    Reviewed: Allergy & Precautions, NPO status , Patient's Chart, lab work & pertinent test results  Airway Mallampati: II  TM Distance: >3 FB Neck ROM: Full    Dental no notable dental hx. (+) Chipped, Missing, Dental Advisory Given, Poor Dentition   Pulmonary neg pulmonary ROS, former smoker,    breath sounds clear to auscultation + decreased breath sounds      Cardiovascular hypertension, Pt. on medications Normal cardiovascular exam Rhythm:Regular Rate:Normal     Neuro/Psych negative neurological ROS  negative psych ROS   GI/Hepatic negative GI ROS, Neg liver ROS,   Endo/Other  Morbid obesity  Renal/GU negative Renal ROS  negative genitourinary   Musculoskeletal negative musculoskeletal ROS (+)   Abdominal (+) + obese,   Peds negative pediatric ROS (+)  Hematology negative hematology ROS (+) HIV,   Anesthesia Other Findings   Reproductive/Obstetrics negative OB ROS                            Anesthesia Physical Anesthesia Plan  ASA: III  Anesthesia Plan: General   Post-op Pain Management:    Induction: Intravenous  PONV Risk Score and Plan: 2 and Ondansetron, Dexamethasone and Treatment may vary due to age or medical condition  Airway Management Planned: Oral ETT  Additional Equipment:   Intra-op Plan:   Post-operative Plan: Extubation in OR  Informed Consent: I have reviewed the patients History and Physical, chart, labs and discussed the procedure including the risks, benefits and alternatives for the proposed anesthesia with the patient or authorized representative who has indicated his/her understanding and acceptance.   Dental advisory given  Plan Discussed with: CRNA and Surgeon  Anesthesia Plan Comments:         Anesthesia Quick Evaluation

## 2017-10-31 NOTE — Anesthesia Procedure Notes (Signed)
Procedure Name: Intubation Date/Time: 10/31/2017 4:47 PM Performed by: Florene Route, CRNA Patient Re-evaluated:Patient Re-evaluated prior to induction Oxygen Delivery Method: Circle system utilized Preoxygenation: Pre-oxygenation with 100% oxygen Induction Type: IV induction, Rapid sequence and Cricoid Pressure applied Laryngoscope Size: Miller and 3 Grade View: Grade I Tube type: Oral Tube size: 8.0 mm Number of attempts: 1 Airway Equipment and Method: Stylet Placement Confirmation: ETT inserted through vocal cords under direct vision,  positive ETCO2 and breath sounds checked- equal and bilateral Secured at: 22 cm Tube secured with: Tape Dental Injury: Teeth and Oropharynx as per pre-operative assessment

## 2017-10-31 NOTE — Progress Notes (Signed)
Pre-op per Pt instructions, gave his belongings (which remained in taped envelope) from security to his significant other ,Nessa. She also took his partial plate in a denture cup.

## 2017-10-31 NOTE — Op Note (Signed)
Preoperative Diagnosis: Acute appendicitis  Postoprative Diagnosis: Same  Procedure: Procedure(s): APPENDECTOMY LAPAROSCOPIC   Surgeon: Glenna Fellows T   Assistants: None  Anesthesia:  General endotracheal anesthesia  Indications: Patient is a 27 year old male who 1 week ago developed acute lower abdominal pain.  CT scan at that time was suspicious for appendicitis.  He however began to feel much better and checked out of the emergency room AMA.  Subsequently over the past 24 hours she is developed recurrent more severe somewhat diffuse lower abdominal pain greater on the right.  Some nausea.  Repeat CT scan has shown evidence of acute appendicitis without perforation.  I recommended proceeding with laparoscopic appendectomy.  I discussed the procedure and indications, alternatives of antibiotic treatment and risks of surgery including anesthetic complications, bleeding, infection or visceral injury.  He understands and agrees to proceed.    Procedure Detail: Patient was brought to the operating room, placed in the supine position on the operating table, and general endotracheal anesthesia induced.  Foley catheter was placed.  The abdomen was widely sterilely prepped and draped.  He was already on broad-spectrum IV antibiotics.  Patient timeout was performed and correct procedure verified.  Due to obesity I used a 5 mm Optiview access in the left upper mid abdomen without difficulty and pneumoperitoneum established.  No evidence of trocar injury.  Under direct vision I placed a 12 mm trocar in the umbilicus and a 5 mm trocar in the left lower quadrant.  The appendix was identified lying lateral to the cecum and was acutely inflamed and thickened but not perforated or evidence of gangrene.  The appendix and tip of the cecum were mobilized dividing lateral peritoneal attachments.  The mesoappendix was then sequentially divided with harmonic scalpel until the appendix was freed down to the tip of  the cecum.  The appendix was divided across the tip of the cecum with a single firing of the Endo GIA 45 mm blue load stapler.  The staple line was intact and without bleeding.  The appendix was placed in an eco-sac and brought out through the umbilical trocar site after dilating it slightly.  The right lower quadrant was irrigated and hemostasis assured.  The fascial defect of the umbilicus was closed with 0 Vicryl.  Skin incisions were closed with subcuticular Monocryl and Dermabond.  Sponge needle and instrument counts were correct.    Findings: Acute appendicitis without perforation or gangrene  Estimated Blood Loss:  Minimal         Drains: None  Blood Given: none          Specimens: Appendix        Complications:  * No complications entered in OR log *         Disposition: PACU - hemodynamically stable.         Condition: stable

## 2017-10-31 NOTE — Anesthesia Procedure Notes (Signed)
Date/Time: 10/31/2017 5:50 PM Performed by: Minerva Ends, CRNA Oxygen Delivery Method: Simple face mask Placement Confirmation: positive ETCO2 and breath sounds checked- equal and bilateral Dental Injury: Teeth and Oropharynx as per pre-operative assessment

## 2017-10-31 NOTE — Transfer of Care (Signed)
Immediate Anesthesia Transfer of Care Note  Patient: Siddhanth Dangerfield  Procedure(s) Performed: APPENDECTOMY LAPAROSCOPIC (N/A )  Patient Location: PACU  Anesthesia Type:General  Level of Consciousness: sedated  Airway & Oxygen Therapy: Patient Spontanous Breathing and Patient connected to face mask oxygen  Post-op Assessment: Report given to RN and Post -op Vital signs reviewed and stable  Post vital signs: Reviewed and stable  Last Vitals:  Vitals Value Taken Time  BP    Temp    Pulse 81 10/31/2017  6:00 PM  Resp 16 10/31/2017  6:00 PM  SpO2 96 % 10/31/2017  6:00 PM  Vitals shown include unvalidated device data.  Last Pain:  Vitals:   10/31/17 1515  TempSrc:   PainSc: 0-No pain         Complications: No apparent anesthesia complications

## 2017-10-31 NOTE — Discharge Instructions (Signed)
[facility] ______[facility] °LAPAROSCOPIC SURGERY: POST OP INSTRUCTIONS °Always review your discharge instruction sheet given to you by the facility where your surgery was performed. °IF YOU HAVE DISABILITY OR FAMILY LEAVE FORMS, YOU MUST BRING THEM TO THE OFFICE FOR PROCESSING.   °DO NOT GIVE THEM TO YOUR DOCTOR. ° °1. A prescription for pain medication may be given to you upon discharge.  Take your pain medication as prescribed, if needed.  If narcotic pain medicine is not needed, then you may take acetaminophen (Tylenol) or ibuprofen (Advil) as needed. °2. Take your usually prescribed medications unless otherwise directed. °3. If you need a refill on your pain medication, please contact your pharmacy.  They will contact our office to request authorization. Prescriptions will not be filled after 5pm or on week-ends. °4. You should follow a light diet the first few days after arrival home, such as soup and crackers, etc.  Be sure to include lots of fluids daily. °5. Most patients will experience some swelling and bruising in the area of the incisions.  Ice packs will help.  Swelling and bruising can take several days to resolve.  °6. It is common to experience some constipation if taking pain medication after surgery.  Increasing fluid intake and taking a stool softener (such as Colace) will usually help or prevent this problem from occurring.  A mild laxative (Milk of Magnesia or Miralax) should be taken according to package instructions if there are no bowel movements after 48 hours. °7. Unless discharge instructions indicate otherwise, you may remove your bandages 24-48 hours after surgery, and you may shower at that time.  You may have steri-strips (small skin tapes) in place directly over the incision.  These strips should be left on the skin for 7-10 days.  If your surgeon used skin glue on the incision, you may shower in 24 hours.  The glue will flake off over the next 2-3 weeks.  Any sutures or  staples will be removed at the office during your follow-up visit. °8. ACTIVITIES:  You may resume regular (light) daily activities beginning the next day--such as daily self-care, walking, climbing stairs--gradually increasing activities as tolerated.  You may have sexual intercourse when it is comfortable.  Refrain from any heavy lifting or straining until approved by your doctor. °a. You may drive when you are no longer taking prescription pain medication, you can comfortably wear a seatbelt, and you can safely maneuver your car and apply brakes. °b. RETURN TO WORK:  __________________________________________________________ °9. You should see your doctor in the office for a follow-up appointment approximately 2-3 weeks after your surgery.  Make sure that you call for this appointment within a day or two after you arrive home to insure a convenient appointment time. °10. OTHER INSTRUCTIONS: __________________________________________________________________________________________________________________________ __________________________________________________________________________________________________________________________ °WHEN TO CALL YOUR DOCTOR: °1. Fever over 101.0 °2. Inability to urinate °3. Continued bleeding from incision. °4. Increased pain, redness, or drainage from the incision. °5. Increasing abdominal pain ° °The clinic staff is available to answer your questions during regular business hours.  Please don’t hesitate to call and ask to speak to one of the nurses for clinical concerns.  If you have a medical emergency, go to the nearest emergency room or call 911.  A surgeon from [facility] is always on call at the hospital. °[address], [address], [place], [place]  [address] ? [address] [address], [place], [place]   [address] °[phone] ? [phone] ? FAX [phone] °Web site:   [email or web address] °

## 2017-10-31 NOTE — ED Notes (Signed)
Unable to get an IV above the hand, 20g right hand

## 2017-10-31 NOTE — H&P (Signed)
Attestation signed by Jimmy Fellows, MD at 10/31/2017 4:13 PM  Patient interviewed and examined, imaging personally reviewed, agree with PA note above.  He has localized right lower quadrant tenderness and imaging consistent with acute appendicitis.  I recommended proceeding with laparoscopic appendectomy.  I discussed alternative of antibiotic treatment.  Discussed the nature of surgery, expected recovery, and risks of bleeding, infection or visceral injury.  He understands and agrees to proceed.  Plan to proceed with laparoscopic appendectomy.    Jimmy Saa MD, FACS  10/31/2017 4:10 PM     Expand All Collapse All    Show:Clear all [] Manual[] Template[] Copied  Added by: [x] Sherrie George, PA-C  [] Hover for details Reason for Consult: possible appendicitis/HIV Referring Physician: E Schlossman  Jimmy Fields is an 27 y.o. male.  HPI: Patient is a 27 year old male who presented on 10/24/2017 with abdominal pain.  CT scan at that time showed and some inflammatory changes around what was thought to be the tip of his appendix but there is no evidence of perforation.  His WBC was elevated at 14,000.  He was seen by Dr. Carolynne Edouard and it was his opinion patient appeared to have a early tip appendicitis he recommended admitting patient to the hospital and starting him on IV antibiotics.  He plan to recheck his white count in the morning and see how he did.  He apparently had a bowel movement and said he felt better, he wanted to follow-up at [facility] where they follow his HIV and noted his insurance went through [facility].  He notes he has not had pain he gets nausea with his HIV medicines and takes Zofran daily with his medicines.  Yesterday   This a.m. he presents with ongoing nausea and vomiting mid abdominal pain that radiates to the right lower quadrant.  Work-up so far shows he is afebrile but hypertensive.  CT scan today shows:  Continued mild  dilatation/prominence of the appendix, now with slight para appendiceal inflammation compatible with changes of early acute appendicitis. Fatty infiltration of the liver.   WBC 12.3, H/H 15/47, Glucose 137.    Clinical summary from 10/24/2017 at [facility] list syphilis gonorrhea chlamydia, HIV, hypertension, abnormal glucose, and obesity as active issues.      Past Medical History:  Diagnosis Date  . HIV (human immunodeficiency virus infection) (HCC)   . Hypertension BMI 49 Possible sleep apnea       History reviewed. No pertinent surgical history.       Family History  Problem Relation Age of Onset  . Heart failure Father     Social History:  reports that he has quit smoking. His smoking use included cigarettes. He has never used smokeless tobacco. He reports that he drinks alcohol. He reports that he has current or past drug history. Drug: Marijuana. Frequency: 14.00 times per week.  Allergies: No Known Allergies         Prior to Admission medications   Medication Sig Start Date End Date Taking? Authorizing Provider  amLODipine (NORVASC) 5 MG tablet Take 5 mg by mouth daily.  10/04/17   [provider]  amoxicillin (AMOXIL) 500 MG capsule Take 1 capsule (500 mg total) by mouth 3 (three) times daily. Patient not taking: Reported on 10/24/2017 08/11/14   Garlon Hatchet, PA-C  aspirin EC 81 MG tablet Take 81 mg by mouth daily.     [provider]  BIKTARVY 50-200-25 MG TABS tablet Take 1 tablet  by mouth daily.  09/29/17   [provider]  cholecalciferol (VITAMIN D) 1000 units tablet Take 1,000 Units by mouth daily.    [provider]  GARLIC PO Take 1 capsule by mouth daily.    [provider]  Multiple Vitamins-Minerals (MULTIVITAMIN ADULT PO) Take 1 tablet by mouth daily.    [provider]  Omega-3 Fatty Acids (FISH OIL) 1000 MG CAPS Take 1 capsule by mouth daily.     [provider]  ondansetron (ZOFRAN) 8 MG tablet Take 8 mg by mouth every 8 (eight) hours as needed for nausea or vomiting.  10/04/17   [provider]     LabResultsLast48Hours  No results found for this or any previous visit (from the past 48 hour(s)).    ImagingResults(Last48hours)  No results found.    Review of Systems  Constitutional: Negative.   HENT: Negative.   Eyes: Negative.   Respiratory: Positive for cough and shortness of breath (No DOE, but PND and occasional orthopneaa).        Pt has orthopnea and PND at times, has been evaluated in the past for sleep apnea, but does not remember much about it.   Friend reported to him his breathing while sleeping is abnormal.    Cardiovascular: Positive for orthopnea.  Gastrointestinal: Positive for abdominal pain (It's rather diffuse right now, he's not sure RLQ is more severe than any other sites.), nausea (This AM with vomiting x 2,  Vomiting is new, he get daily nausea with HIV mediciations and is on Zofram at home.  ) and vomiting. Negative for blood in stool, constipation, diarrhea, heartburn and melena.  Genitourinary: Negative.   Musculoskeletal: Negative.   Skin: Negative.   Neurological: Negative.   Endo/Heme/Allergies: Negative.   Psychiatric/Behavioral: Negative.    Blood pressure (!) 165/128, pulse 80, temperature 97.9 F (36.6 C), temperature source Oral, resp. rate 16, SpO2 99 %. Physical Exam  Constitutional: He is oriented to person, place, and time. He appears well-developed and well-nourished. No distress.  Morbidly obese male in no acute distress  HENT:  Head: Normocephalic and atraumatic.  Mouth/Throat: Oropharynx is clear and moist.  Eyes: Right eye exhibits no discharge. Left eye exhibits no discharge. No scleral icterus.  Pupils are equal  Neck: Normal range of motion. Neck supple. No JVD present. No tracheal deviation present. No thyromegaly present.  Cardiovascular:  Normal rate, regular rhythm, normal heart sounds and intact distal pulses.  No murmur heard. Respiratory: Effort normal and breath sounds normal. No respiratory distress. He has no wheezes. He has no rales. He exhibits no tenderness.  GI: He exhibits distension. He exhibits no mass. There is tenderness (rather grossly tender, mid abdomen, not sure it goes just to the RLQ.). There is no rebound and no guarding.  Morbidly obese male in no acute distress.    Musculoskeletal: He exhibits no edema or tenderness.  Lymphadenopathy:    He has no cervical adenopathy.  Neurological: He is alert and oriented to person, place, and time. No cranial nerve deficit.  Skin: Skin is warm and dry. No rash noted. He is not diaphoretic. No erythema. No pallor.  Psychiatric: He has a normal mood and affect. His behavior is normal. Judgment and thought content normal.    Assessment/Plan: Acute appendicitis HIV - Rx at [facility] since 2017 Recent Rx for syphilis, gonorrhea, chlamydia  Morbid obesity BMI 49 Probable sleep apnea Hypertension    Plan:  Admit for IV fluids, antibiotics and surgery  later today.     Carollee Nussbaumer 10/31/2017, 9:26 AM            Cosigned by: Jimmy Fellows, MD at 10/31/2017 4:13 PM

## 2017-10-31 NOTE — ED Notes (Signed)
Pt returned from CT °

## 2017-10-31 NOTE — ED Notes (Signed)
Pt to restroom

## 2017-10-31 NOTE — Consult Note (Signed)
Reason for Consult: possible appendicitis/HIV Referring Physician: E Schlossman  Jimmy Fields is an 27 y.o. male.  HPI: Patient is a 27 year old male who presented on 10/24/2017 with abdominal pain.  CT scan at that time showed and some inflammatory changes around what was thought to be the tip of his appendix but there is no evidence of perforation.  His WBC was elevated at 14,000.  He was seen by Dr. Carolynne Edouard and it was his opinion patient appeared to have a early tip appendicitis he recommended admitting patient to the hospital and starting him on IV antibiotics.  He plan to recheck his white count in the morning and see how he did.  He apparently had a bowel movement and said he felt better, he wanted to follow-up at [facility] where they follow his HIV and noted his insurance went through [facility].  He notes he has not had pain he gets nausea with his HIV medicines and takes Zofran daily with his medicines.  Yesterday   This a.m. he presents with ongoing nausea and vomiting mid abdominal pain that radiates to the right lower quadrant.  Work-up so far shows he is afebrile but hypertensive.  CT scan today shows:  Continued mild dilatation/prominence of the appendix, now with slight para appendiceal inflammation compatible with changes of early acute appendicitis. Fatty infiltration of the liver.   WBC 12.3, H/H 15/47, Glucose 137.    Clinical summary from 10/24/2017 at [facility] list syphilis gonorrhea chlamydia, HIV, hypertension, abnormal glucose, and obesity as active issues.  Past Medical History:  Diagnosis Date  . HIV (human immunodeficiency virus infection) (HCC)   . Hypertension BMI 49 Possible sleep apnea       History reviewed. No pertinent surgical history.  Family History  Problem Relation Age of Onset  . Heart failure Father     Social History:  reports that he has quit smoking. His smoking use included cigarettes. He has  never used smokeless tobacco. He reports that he drinks alcohol. He reports that he has current or past drug history. Drug: Marijuana. Frequency: 14.00 times per week.  Allergies: No Known Allergies  Prior to Admission medications   Medication Sig Start Date End Date Taking? Authorizing Provider  amLODipine (NORVASC) 5 MG tablet Take 5 mg by mouth daily.  10/04/17   [provider]  amoxicillin (AMOXIL) 500 MG capsule Take 1 capsule (500 mg total) by mouth 3 (three) times daily. Patient not taking: Reported on 10/24/2017 08/11/14   Garlon Hatchet, PA-C  aspirin EC 81 MG tablet Take 81 mg by mouth daily.     [provider]  BIKTARVY 50-200-25 MG TABS tablet Take 1 tablet by mouth daily.  09/29/17   [provider]  cholecalciferol (VITAMIN D) 1000 units tablet Take 1,000 Units by mouth daily.    [provider]  GARLIC PO Take 1 capsule by mouth daily.    [provider]  Multiple Vitamins-Minerals (MULTIVITAMIN ADULT PO) Take 1 tablet by mouth daily.    [provider]  Omega-3 Fatty Acids (FISH OIL) 1000 MG CAPS Take 1 capsule by mouth daily.    [provider]  ondansetron (ZOFRAN) 8 MG tablet Take 8 mg by mouth every 8 (eight) hours as needed for nausea or vomiting.  10/04/17   [provider]     No results found for this or any previous visit (from the past 48 hour(s)).  No results found.  Review of Systems  Constitutional: Negative.   HENT: Negative.   Eyes: Negative.   Respiratory: Positive for cough and shortness of breath (No DOE, but PND and occasional orthopneaa).        Pt has orthopnea and PND at times, has been evaluated in the past for sleep apnea, but does not remember much about it.   Friend reported to him his breathing while sleeping is abnormal.    Cardiovascular: Positive for orthopnea.  Gastrointestinal: Positive for abdominal pain (It's rather diffuse right now, he's not sure RLQ is more severe  than any other sites.), nausea (This AM with vomiting x 2,  Vomiting is new, he get daily nausea with HIV mediciations and is on Zofram at home.  ) and vomiting. Negative for blood in stool, constipation, diarrhea, heartburn and melena.  Genitourinary: Negative.   Musculoskeletal: Negative.   Skin: Negative.   Neurological: Negative.   Endo/Heme/Allergies: Negative.   Psychiatric/Behavioral: Negative.    Blood pressure (!) 165/128, pulse 80, temperature 97.9 F (36.6 C), temperature source Oral, resp. rate 16, SpO2 99 %. Physical Exam  Constitutional: He is oriented to person, place, and time. He appears well-developed and well-nourished. No distress.  Morbidly obese male in no acute distress  HENT:  Head: Normocephalic and atraumatic.  Mouth/Throat: Oropharynx is clear and moist.  Eyes: Right eye exhibits no discharge. Left eye exhibits no discharge. No scleral icterus.  Pupils are equal  Neck: Normal range of motion. Neck supple. No JVD present. No tracheal deviation present. No thyromegaly present.  Cardiovascular: Normal rate, regular rhythm, normal heart sounds and intact distal pulses.  No murmur heard. Respiratory: Effort normal and breath sounds normal. No respiratory distress. He has no wheezes. He has no rales. He exhibits no tenderness.  GI: He exhibits distension. He exhibits no mass. There is tenderness (rather grossly tender, mid abdomen, not sure it goes just to the RLQ.). There is no rebound and no guarding.  Morbidly obese male in no acute distress.    Musculoskeletal: He exhibits no edema or tenderness.  Lymphadenopathy:    He has no cervical adenopathy.  Neurological: He is alert and oriented to person, place, and time. No cranial nerve deficit.  Skin: Skin is warm and dry. No rash noted. He is not diaphoretic. No erythema. No pallor.  Psychiatric: He has a normal mood and affect. His behavior is normal. Judgment and thought content normal.     Assessment/Plan: Acute appendicitis HIV - Rx at [facility] since 2017 Recent Rx for syphilis, gonorrhea, chlamydia  Morbid obesity BMI 49 Probable sleep apnea Hypertension    Plan:  Admit for IV fluids, antibiotics and surgery later today.     Evanie Buckle 10/31/2017, 9:26 AM

## 2017-10-31 NOTE — ED Provider Notes (Signed)
[facility]-EMERGENCY DEPT Provider Note   CSN: [id number] Arrival date & time: 10/31/17  4098     History   Chief Complaint Chief Complaint  Patient presents with  . Abdominal Pain  . Nausea  . Back Pain    HPI Jimmy Fields is a 27 y.o. male.  HPI   Had been seen last week, diagnosed with early tip appendicitis, left AMA< initially felt improved, no significant pain, however this morning developed worsening pain, burning abdominal pain, diffuse, sharp around navel.  Nothing makes it better or worse.  No fevers. Nausea, threw up 3 times, began this AM around3AM.  Constipation, not passing gas today. Last BM was yesterday evening, normally has frequent stools.   Past Medical History:  Diagnosis Date  . HIV (human immunodeficiency virus infection) (HCC)   . Hypertension     Patient Active Problem List   Diagnosis Date Noted  . Appendicitis 10/25/2017    History reviewed. No pertinent surgical history.      Home Medications    Prior to Admission medications   Medication Sig Start Date End Date Taking? Authorizing Provider  amLODipine (NORVASC) 5 MG tablet Take 5 mg by mouth daily.  10/04/17  Yes [provider]  aspirin EC 81 MG tablet Take 81 mg by mouth daily.    Yes [provider]  BIKTARVY 50-200-25 MG TABS tablet Take 1 tablet by mouth daily.  09/29/17  Yes [provider]  bismuth subsalicylate (PEPTO BISMOL) 262 MG/15ML suspension Take 15 mLs by mouth every 6 (six) hours as needed for indigestion.   Yes [provider]  cholecalciferol (VITAMIN D) 1000 units tablet Take 1,000 Units by mouth daily.   Yes [provider]  GARLIC PO Take 1 capsule by mouth daily.   Yes [provider]  Multiple Vitamins-Minerals (MULTIVITAMIN ADULT PO) Take 1 tablet by mouth daily.   Yes [provider]  Omega-3 Fatty Acids (FISH OIL) 1000 MG CAPS Take 1 capsule by mouth daily.   Yes [provider]  ondansetron (ZOFRAN) 8 MG tablet Take 8 mg by mouth every 8 (eight) hours as needed for nausea or vomiting.  10/04/17  Yes [provider]  sodium-potassium bicarbonate (ALKA-SELTZER GOLD) TBEF dissolvable tablet Take 1 tablet by mouth daily as needed (heartburn). Dissolve in a bottle of water   Yes [provider]    Family History Family History  Problem Relation Age of Onset  . Heart failure Father     Social History Social History   Tobacco Use  . Smoking status: Former Smoker    Types: Cigarettes  . Smokeless tobacco: Never Used  Substance Use Topics  . Alcohol use: Yes  . Drug use: Yes    Frequency: 14.0 times per week    Types: Marijuana    Comment: 3-4 times a week     Allergies   Patient has no known allergies.   Review of Systems Review of Systems  Constitutional: Negative for fever.  HENT: Negative for sore throat.   Eyes: Negative for visual disturbance.  Respiratory: Negative for shortness of breath.   Cardiovascular: Negative for chest pain.  Gastrointestinal: Positive for abdominal pain, constipation, nausea and vomiting. Negative for diarrhea.  Genitourinary: Negative for difficulty urinating and enuresis.  Musculoskeletal: Negative for back pain and neck stiffness.  Skin: Negative for rash.  Neurological: Negative for syncope and headaches.     Physical Exam Updated Vital Signs BP (!) 183/113  Pulse 76   Temp 97.9 F (36.6 C) (Oral)   Resp 16   SpO2 100%   Physical Exam  Constitutional: He is oriented to person, place, and time. He appears well-developed and well-nourished. No distress.  HENT:  Head: Normocephalic and atraumatic.  Eyes: Conjunctivae and EOM are normal.  Neck: Normal range of motion.  Cardiovascular: Normal rate, regular rhythm, normal heart sounds and intact distal pulses. Exam reveals no gallop and no friction rub.  No murmur heard. Pulmonary/Chest: Effort normal and breath sounds  normal. No respiratory distress. He has no wheezes. He has no rales.  Abdominal: Soft. He exhibits no distension. There is no tenderness. There is no guarding.  Musculoskeletal: He exhibits no edema.  Neurological: He is alert and oriented to person, place, and time.  Skin: Skin is warm and dry. He is not diaphoretic.  Nursing note and vitals reviewed.    ED Treatments / Results  Labs (all labs ordered are listed, but only abnormal results are displayed) Labs Reviewed  CBC WITH DIFFERENTIAL/PLATELET - Abnormal; Notable for the following components:      Result Value   WBC 12.3 (*)    RBC 5.86 (*)    Monocytes Absolute 1.3 (*)    All other components within normal limits  COMPREHENSIVE METABOLIC PANEL - Abnormal; Notable for the following components:   Glucose, Bld 137 (*)    ALT 88 (*)    All other components within normal limits  LIPASE, BLOOD    EKG None  Radiology Ct Abdomen Pelvis W Contrast  Result Date: 10/31/2017 CLINICAL DATA:  Known appendicitis, follow-up. EXAM: CT ABDOMEN AND PELVIS WITH CONTRAST TECHNIQUE: Multidetector CT imaging of the abdomen and pelvis was performed using the standard protocol following bolus administration of intravenous contrast. CONTRAST:  ISOVUE-300 IOPAMIDOL (ISOVUE-300) INJECTION 61% COMPARISON:  10/24/2017 FINDINGS: Lower chest: Lung bases are clear. No effusions. Heart is normal size. Hepatobiliary: Diffuse low-density throughout the liver compatible with fatty infiltration. No focal abnormality. Gallbladder unremarkable. Pancreas: No focal abnormality or ductal dilatation. Spleen: No focal abnormality.  Normal size. Adrenals/Urinary Tract: No adrenal abnormality. No focal renal abnormality. No stones or hydronephrosis. Urinary bladder is unremarkable. Stomach/Bowel: Continued mild prominence of the appendix, approximately 12 mm. Slight stranding in the periappendiceal fat, best seen on coronal image 79. Findings most compatible with  early acute appendicitis. Stomach, large and small bowel grossly unremarkable. Vascular/Lymphatic: No evidence of aneurysm or adenopathy. Reproductive: No visible focal abnormality. Other: No free fluid or free air. Small right inguinal hernia containing fat. Musculoskeletal: No acute bony abnormality. IMPRESSION: Continued mild dilatation/prominence of the appendix, now with slight para appendiceal inflammation compatible with changes of early acute appendicitis. Fatty infiltration of the liver. Electronically Signed   By: Charlett Nose M.D.   On: 10/31/2017 10:56    Procedures Procedures (including critical care time)  Medications Ordered in ED Medications  sodium chloride 0.9 % injection (  Canceled Entry 10/31/17 1120)  sodium chloride 0.9 % bolus 1,000 mL (0 mLs Intravenous Stopped 10/31/17 1103)  ondansetron (ZOFRAN) injection 4 mg (4 mg Intravenous Given 10/31/17 0942)  morphine 4 MG/ML injection 4 mg (4 mg Intravenous Given 10/31/17 0942)  iopamidol (ISOVUE-300) 61 % injection 100 mL ( Intravenous Canceled Entry 10/31/17 1120)  morphine 4 MG/ML injection 4 mg (4 mg Intravenous Given 10/31/17 1034)  morphine 4 MG/ML injection 4 mg (4 mg Intravenous Given 10/31/17 1132)     Initial Impression / Assessment and Plan / ED Course  I have reviewed the triage vital signs and the nursing notes.  Pertinent labs & imaging results that were available during my care of the patient were reviewed by me and considered in my medical decision making (see chart for details).     26 year old male with history of HIV, and diagnosis of suspected early tip appendicitis made last week who left AGAINST MEDICAL ADVICE, presents with concern for worsening abdominal pain and nausea.  Labs show mild leukocytosis, no other significant findings.  Called general surgery regarding patient's return as patient had left from their service. CT abdomen pelvis was repeated showing concern for early appendicitis, increased  inflammation than prior CT.  General Surgery evaluated patient and is posting for surgery.   Final Clinical Impressions(s) / ED Diagnoses   Final diagnoses:  Other acute appendicitis    ED Discharge Orders    None       Alvira Monday, MD 10/31/17 1223

## 2017-11-01 ENCOUNTER — Encounter (HOSPITAL_COMMUNITY): Payer: Self-pay | Admitting: General Surgery

## 2017-11-01 ENCOUNTER — Emergency Department (HOSPITAL_COMMUNITY)
Admission: EM | Admit: 2017-11-01 | Discharge: 2017-11-01 | Disposition: A | Discharge status: Home / Self Care | Payer: Self-pay | Attending: Emergency Medicine | Admitting: Emergency Medicine

## 2017-11-01 DIAGNOSIS — Z87891 Personal history of nicotine dependence: Secondary | ICD-10-CM | POA: Insufficient documentation

## 2017-11-01 DIAGNOSIS — Y829 Unspecified medical devices associated with adverse incidents: Secondary | ICD-10-CM | POA: Insufficient documentation

## 2017-11-01 DIAGNOSIS — I10 Essential (primary) hypertension: Secondary | ICD-10-CM | POA: Insufficient documentation

## 2017-11-01 DIAGNOSIS — F1290 Cannabis use, unspecified, uncomplicated: Secondary | ICD-10-CM | POA: Insufficient documentation

## 2017-11-01 DIAGNOSIS — T8130XA Disruption of wound, unspecified, initial encounter: Secondary | ICD-10-CM | POA: Insufficient documentation

## 2017-11-01 DIAGNOSIS — Z79899 Other long term (current) drug therapy: Secondary | ICD-10-CM | POA: Insufficient documentation

## 2017-11-01 DIAGNOSIS — Z7982 Long term (current) use of aspirin: Secondary | ICD-10-CM | POA: Insufficient documentation

## 2017-11-01 DIAGNOSIS — B20 Human immunodeficiency virus [HIV] disease: Secondary | ICD-10-CM | POA: Insufficient documentation

## 2017-11-01 MED ORDER — DOCUSATE SODIUM 100 MG PO CAPS: 100.0000 mg | ORAL_CAPSULE | Freq: Every day | ORAL | Status: AC

## 2017-11-01 MED ORDER — POLYETHYLENE GLYCOL 3350 17 G PO PACK: 17.0000 g | PACK | Freq: Every day | ORAL | 0 refills | Status: DC

## 2017-11-01 MED ORDER — OXYCODONE HCL 10 MG PO TABS: 5.0000 mg | ORAL_TABLET | Freq: Four times a day (QID) | ORAL | 0 refills | Status: DC | PRN

## 2017-11-01 MED ORDER — OXYCODONE HCL 10 MG PO TABS: 10.0000 mg | ORAL_TABLET | Freq: Four times a day (QID) | ORAL | 0 refills | Status: DC | PRN

## 2017-11-01 NOTE — ED Triage Notes (Signed)
Pt is c/o umbilicus incision drainage and pain, discharged earlier today after laparoscopic appendectomy.

## 2017-11-01 NOTE — Discharge Summary (Signed)
[facility]/Trauma Discharge Summary   Patient ID: Jimmy Fields MRN: [id number] DOB/AGE: 10-15-1990 27 y.o.  Admit date: 10/31/2017 Discharge date: 11/01/2017  Admitting Diagnosis: Appendicitis   Discharge Diagnosis Patient Active Problem List   Diagnosis Date Noted  . HIV (human immunodeficiency virus infection) (HCC)   . Hypertension   . Acute appendicitis s/p lap appendectomy 10/31/2017 10/25/2017    Consultants none  Imaging: Ct Abdomen Pelvis W Contrast  Result Date: 10/31/2017 CLINICAL DATA:  Known appendicitis, follow-up. EXAM: CT ABDOMEN AND PELVIS WITH CONTRAST TECHNIQUE: Multidetector CT imaging of the abdomen and pelvis was performed using the standard protocol following bolus administration of intravenous contrast. CONTRAST:  ISOVUE-300 IOPAMIDOL (ISOVUE-300) INJECTION 61% COMPARISON:  10/24/2017 FINDINGS: Lower chest: Lung bases are clear. No effusions. Heart is normal size. Hepatobiliary: Diffuse low-density throughout the liver compatible with fatty infiltration. No focal abnormality. Gallbladder unremarkable. Pancreas: No focal abnormality or ductal dilatation. Spleen: No focal abnormality.  Normal size. Adrenals/Urinary Tract: No adrenal abnormality. No focal renal abnormality. No stones or hydronephrosis. Urinary bladder is unremarkable. Stomach/Bowel: Continued mild prominence of the appendix, approximately 12 mm. Slight stranding in the periappendiceal fat, best seen on coronal image 79. Findings most compatible with early acute appendicitis. Stomach, large and small bowel grossly unremarkable. Vascular/Lymphatic: No evidence of aneurysm or adenopathy. Reproductive: No visible focal abnormality. Other: No free fluid or free air. Small right inguinal hernia containing fat. Musculoskeletal: No acute bony abnormality. IMPRESSION: Continued mild dilatation/prominence of the appendix, now with slight para appendiceal inflammation compatible with changes of  early acute appendicitis. Fatty infiltration of the liver. Electronically Signed   By: Charlett Nose M.D.   On: 10/31/2017 10:56    Procedures Dr. Johna Sheriff (10/31/17) - Laparoscopic Appendectomy  HPI: Patient is a 27 year old male who presented on 10/24/2017 with abdominal pain.  CT scan at that time showed and some inflammatory changes around what was thought to be the tip of his appendix but there is no evidence of perforation.  His WBC was elevated at 14,000.  He was seen by Dr. Carolynne Edouard and it was his opinion patient appeared to have a early tip appendicitis he recommended admitting patient to the hospital and starting him on IV antibiotics.  He plan to recheck his white count in the morning and see how he did.  He apparently had a bowel movement and said he felt better, he wanted to follow-up at [facility] where they follow his HIV and noted his insurance went through [facility].  He notes he has not had pain he gets nausea with his HIV medicines and takes Zofran daily with his medicines.  Yesterday   This a.m. he presents with ongoing nausea and vomiting mid abdominal pain that radiates to the right lower quadrant.  Work-up so far shows he is afebrile but hypertensive.  CT scan today shows:  Continued mild dilatation/prominence of the appendix, now with slight para appendiceal inflammation compatible with changes of early acute appendicitis. Fatty infiltration of the liver.   WBC 12.3, H/H 15/47, Glucose 137.  Hospital Course:  Patient was admitted and underwent procedure listed above.  Tolerated procedure well and was transferred to the floor.  Diet was advanced as tolerated.  On POD#1, the patient was voiding well, tolerating diet, ambulating well, pain well controlled, vital signs stable, incisions c/d/i and felt stable for discharge home.  Patient will follow up as outlined below and knows to call with questions or concerns. Discussed f/u with PCP  within a week to  discuss elevated BP.      Patient was discharged in good condition.  The [place] Substance controlled database was reviewed prior to prescribing narcotic pain medication to this patient.  Physical Exam: General:  Alert, NAD, pleasant, cooperative Resp: rate and effort normal Abd:  Soft, ND, normal bowel sounds, mild tenderness around port sites, incisions with glue intact appear well with mild ecchymosis and without drainage Skin: warm and dry, no rashes noted  Allergies as of 11/01/2017   No Known Allergies     Medication List    TAKE these medications   amLODipine 5 MG tablet Commonly known as:  NORVASC Take 5 mg by mouth daily.   aspirin EC 81 MG tablet Take 81 mg by mouth daily.   BIKTARVY 50-200-25 MG Tabs tablet Generic drug:  bictegravir-emtricitabine-tenofovir AF Take 1 tablet by mouth daily.   bismuth subsalicylate 262 MG/15ML suspension Commonly known as:  PEPTO BISMOL Take 15 mLs by mouth every 6 (six) hours as needed for indigestion.   cholecalciferol 1000 units tablet Commonly known as:  VITAMIN D Take 1,000 Units by mouth daily.   docusate sodium 100 MG capsule Commonly known as:  COLACE Take 1 capsule (100 mg total) by mouth daily for 30 doses.   Fish Oil 1000 MG Caps Take 1 capsule by mouth daily.   GARLIC PO Take 1 capsule by mouth daily.   MULTIVITAMIN ADULT PO Take 1 tablet by mouth daily.   ondansetron 8 MG tablet Commonly known as:  ZOFRAN Take 8 mg by mouth every 8 (eight) hours as needed for nausea or vomiting.   Oxycodone HCl 10 MG Tabs Take 1 tablet (10 mg total) by mouth every 6 (six) hours as needed.   polyethylene glycol packet Commonly known as:  MIRALAX / GLYCOLAX Take 17 g by mouth daily.   sodium-potassium bicarbonate Tbef dissolvable tablet Commonly known as:  ALKA-SELTZER GOLD Take 1 tablet by mouth daily as needed (heartburn). Dissolve in a bottle of water        Follow-up Information    [facility] Follow up on 11/14/2017.   Specialty:  General Surgery Why:  Your appointment is at 3:30 pm.  Be at the office 30 minutes early.  Bring photo ID and insurance information with you.   Contact information: [address] [place] [address] [phone]        [facility] Infectious disease clinic Follow up.   Why:  [facility] and let them know you had surgery and follow up for HIV as soon as possible. Also discuss with them or your primary provider your elevated blood pressure.          Signed: Kathyrn Drown [facility] 11/01/2017, 8:39 AM Pager: [phone] Consults: [phone] Mon-Fri 7:00 am-4:30 pm Sat-Sun 7:00 am-11:30 am

## 2017-11-01 NOTE — Progress Notes (Addendum)
To whom it may concern:  Jimmy Fields was hospitalized on 10/31/2017. He may return to work on 11/08/2017. Nature of illness or injury: surgery WORK-EMPLOYMENT: restrictions no lifting >20lbs for 3 weeks.

## 2017-11-01 NOTE — ED Provider Notes (Signed)
[facility]-EMERGENCY DEPT Provider Note   CSN: [id number] Arrival date & time: 11/01/17  2026     History   Chief Complaint Chief Complaint  Patient presents with  . Post-op Problem    HPI Jimmy Fields is a 27 y.o. male.  Pt presents to the ED today with bleeding from op site.  Pt had his appendix removed yesterday and was d/c today.  He noticed some blood coming from wound this afternoon.     Past Medical History:  Diagnosis Date  . HIV (human immunodeficiency virus infection) (HCC)   . Hypertension     Patient Active Problem List   Diagnosis Date Noted  . HIV (human immunodeficiency virus infection) (HCC)   . Hypertension   . Acute appendicitis s/p lap appendectomy 10/31/2017 10/25/2017    Past Surgical History:  Procedure Laterality Date  . LAPAROSCOPIC APPENDECTOMY N/A 10/31/2017   Procedure: APPENDECTOMY LAPAROSCOPIC;  Surgeon: Glenna Fellows, MD;  Location: [facility] ORS;  Service: General;  Laterality: N/A;        Home Medications    Prior to Admission medications   Medication Sig Start Date End Date Taking? Authorizing Provider  amLODipine (NORVASC) 5 MG tablet Take 5 mg by mouth daily.  10/04/17   [provider]  aspirin EC 81 MG tablet Take 81 mg by mouth daily.     [provider]  BIKTARVY 50-200-25 MG TABS tablet Take 1 tablet by mouth daily.  09/29/17   [provider]  bismuth subsalicylate (PEPTO BISMOL) 262 MG/15ML suspension Take 15 mLs by mouth every 6 (six) hours as needed for indigestion.    [provider]  cholecalciferol (VITAMIN D) 1000 units tablet Take 1,000 Units by mouth daily.    [provider]  docusate sodium (COLACE) 100 MG capsule Take 1 capsule (100 mg total) by mouth daily for 30 doses. 11/01/17 12/01/17  Focht, Jessica L, PA  GARLIC PO Take 1 capsule by mouth daily.    [provider]  Multiple Vitamins-Minerals (MULTIVITAMIN ADULT PO) Take 1 tablet by mouth  daily.    [provider]  Omega-3 Fatty Acids (FISH OIL) 1000 MG CAPS Take 1 capsule by mouth daily.    [provider]  ondansetron (ZOFRAN) 8 MG tablet Take 8 mg by mouth every 8 (eight) hours as needed for nausea or vomiting.  10/04/17   [provider]  Oxycodone HCl 10 MG TABS Take 1 tablet (10 mg total) by mouth every 6 (six) hours as needed. 11/01/17   Focht, Joyce Copa, PA  polyethylene glycol (MIRALAX) packet Take 17 g by mouth daily. 11/01/17   Focht, Joyce Copa, PA  sodium-potassium bicarbonate (ALKA-SELTZER GOLD) TBEF dissolvable tablet Take 1 tablet by mouth daily as needed (heartburn). Dissolve in a bottle of water    [provider]    Family History Family History  Problem Relation Age of Onset  . Heart failure Father     Social History Social History   Tobacco Use  . Smoking status: Former Smoker    Types: Cigarettes  . Smokeless tobacco: Never Used  Substance Use Topics  . Alcohol use: Yes  . Drug use: Yes    Frequency: 14.0 times per week    Types: Marijuana    Comment: 3-4 times a week     Allergies   Patient has no known allergies.   Review of Systems Review of Systems  Skin: Positive for wound.  All other systems reviewed and  are negative.    Physical Exam Updated Vital Signs BP (!) 157/106 (BP Location: Right Arm)   Pulse 72   Temp 98.5 F (36.9 C) (Oral)   Resp 16   Ht 5\' 10"  (1.778 m)   Wt (!) 154.2 kg   SpO2 97%   BMI 48.78 kg/m   Physical Exam  Constitutional: He is oriented to person, place, and time. He appears well-developed and well-nourished.  HENT:  Head: Normocephalic and atraumatic.  Right Ear: External ear normal.  Left Ear: External ear normal.  Nose: Nose normal.  Mouth/Throat: Oropharynx is clear and moist.  Eyes: Pupils are equal, round, and reactive to light. Conjunctivae and EOM are normal.  Neck: Normal range of motion. Neck supple.  Cardiovascular: Normal rate, regular rhythm,  normal heart sounds and intact distal pulses.  Pulmonary/Chest: Effort normal and breath sounds normal.  Abdominal: Soft. Bowel sounds are normal.  Musculoskeletal: Normal range of motion.  Neurological: He is alert and oriented to person, place, and time.  Skin: Skin is warm and dry. Capillary refill takes less than 2 seconds.  Wound dehiscence umbilical wound  Psychiatric: He has a normal mood and affect.  Nursing note and vitals reviewed.    ED Treatments / Results  Labs (all labs ordered are listed, but only abnormal results are displayed) Labs Reviewed - No data to display  EKG None  Radiology Ct Abdomen Pelvis W Contrast  Result Date: 10/31/2017 CLINICAL DATA:  Known appendicitis, follow-up. EXAM: CT ABDOMEN AND PELVIS WITH CONTRAST TECHNIQUE: Multidetector CT imaging of the abdomen and pelvis was performed using the standard protocol following bolus administration of intravenous contrast. CONTRAST:  ISOVUE-300 IOPAMIDOL (ISOVUE-300) INJECTION 61% COMPARISON:  10/24/2017 FINDINGS: Lower chest: Lung bases are clear. No effusions. Heart is normal size. Hepatobiliary: Diffuse low-density throughout the liver compatible with fatty infiltration. No focal abnormality. Gallbladder unremarkable. Pancreas: No focal abnormality or ductal dilatation. Spleen: No focal abnormality.  Normal size. Adrenals/Urinary Tract: No adrenal abnormality. No focal renal abnormality. No stones or hydronephrosis. Urinary bladder is unremarkable. Stomach/Bowel: Continued mild prominence of the appendix, approximately 12 mm. Slight stranding in the periappendiceal fat, best seen on coronal image 79. Findings most compatible with early acute appendicitis. Stomach, large and small bowel grossly unremarkable. Vascular/Lymphatic: No evidence of aneurysm or adenopathy. Reproductive: No visible focal abnormality. Other: No free fluid or free air. Small right inguinal hernia containing fat. Musculoskeletal: No acute  bony abnormality. IMPRESSION: Continued mild dilatation/prominence of the appendix, now with slight para appendiceal inflammation compatible with changes of early acute appendicitis. Fatty infiltration of the liver. Electronically Signed   By: Charlett Nose M.D.   On: 10/31/2017 10:56    Procedures Procedures (including critical care time)  Medications Ordered in ED Medications - No data to display   Initial Impression / Assessment and Plan / ED Course  I have reviewed the triage vital signs and the nursing notes.  Pertinent labs & imaging results that were available during my care of the patient were reviewed by me and considered in my medical decision making (see chart for details).    Wound cleaned and re-dermabonded.  Pt stable for d/c.  Return if worse.  Final Clinical Impressions(s) / ED Diagnoses   Final diagnoses:  Wound dehiscence    ED Discharge Orders    None       Jacalyn Lefevre, MD 11/01/17 2150

## 2017-11-11 NOTE — Anesthesia Postprocedure Evaluation (Signed)
Anesthesia Post Note  Patient: Economist  Procedure(s) Performed: APPENDECTOMY LAPAROSCOPIC (N/A )     Patient location during evaluation: PACU Anesthesia Type: General Level of consciousness: awake and alert Pain management: pain level controlled Vital Signs Assessment: post-procedure vital signs reviewed and stable Respiratory status: spontaneous breathing, nonlabored ventilation, respiratory function stable and patient connected to nasal cannula oxygen Cardiovascular status: blood pressure returned to baseline and stable Postop Assessment: no apparent nausea or vomiting Anesthetic complications: no    Last Vitals:  Vitals:   11/01/17 0543 11/01/17 0925  BP: (!) 138/91 (!) 143/88  Pulse: 74 87  Resp: 16 20  Temp: 36.9 C 36.9 C  SpO2: 98% 97%    Last Pain:  Vitals:   11/01/17 0925  TempSrc: Oral  PainSc:                  Eleanor Dimichele S

## 2019-07-12 ENCOUNTER — Emergency Department (HOSPITAL_COMMUNITY): Payer: Self-pay

## 2019-07-12 ENCOUNTER — Emergency Department (HOSPITAL_COMMUNITY)
Admission: EM | Admit: 2019-07-12 | Discharge: 2019-07-12 | Disposition: A | Discharge status: Home / Self Care | Payer: Self-pay | Attending: Emergency Medicine | Admitting: Emergency Medicine

## 2019-07-12 ENCOUNTER — Other Ambulatory Visit: Payer: Self-pay

## 2019-07-12 ENCOUNTER — Encounter (HOSPITAL_COMMUNITY): Payer: Self-pay | Admitting: *Deleted

## 2019-07-12 DIAGNOSIS — R0789 Other chest pain: Secondary | ICD-10-CM | POA: Insufficient documentation

## 2019-07-12 DIAGNOSIS — Z20822 Contact with and (suspected) exposure to covid-19: Secondary | ICD-10-CM | POA: Insufficient documentation

## 2019-07-12 DIAGNOSIS — Z7982 Long term (current) use of aspirin: Secondary | ICD-10-CM | POA: Insufficient documentation

## 2019-07-12 DIAGNOSIS — Z792 Long term (current) use of antibiotics: Secondary | ICD-10-CM | POA: Insufficient documentation

## 2019-07-12 DIAGNOSIS — R059 Cough, unspecified: Secondary | ICD-10-CM

## 2019-07-12 DIAGNOSIS — Z87891 Personal history of nicotine dependence: Secondary | ICD-10-CM | POA: Insufficient documentation

## 2019-07-12 DIAGNOSIS — I10 Essential (primary) hypertension: Secondary | ICD-10-CM | POA: Insufficient documentation

## 2019-07-12 DIAGNOSIS — R0609 Other forms of dyspnea: Secondary | ICD-10-CM | POA: Insufficient documentation

## 2019-07-12 LAB — CBC WITH DIFFERENTIAL/PLATELET
Abs Immature Granulocytes: 0.03 10*3/uL (ref 0.00–0.07)
Basophils Absolute: 0 10*3/uL (ref 0.0–0.1)
Basophils Relative: 0 %
Eosinophils Absolute: 0.3 10*3/uL (ref 0.0–0.5)
Eosinophils Relative: 3 %
HCT: 45 % (ref 39.0–52.0)
Hemoglobin: 14.1 g/dL (ref 13.0–17.0)
Immature Granulocytes: 0 %
Lymphocytes Relative: 22 %
Lymphs Abs: 2.5 10*3/uL (ref 0.7–4.0)
MCH: 26.5 pg (ref 26.0–34.0)
MCHC: 31.3 g/dL (ref 30.0–36.0)
MCV: 84.4 fL (ref 80.0–100.0)
Monocytes Absolute: 1.5 10*3/uL — ABNORMAL HIGH (ref 0.1–1.0)
Monocytes Relative: 14 %
Neutro Abs: 6.7 10*3/uL (ref 1.7–7.7)
Neutrophils Relative %: 61 %
Platelets: 228 10*3/uL (ref 150–400)
RBC: 5.33 MIL/uL (ref 4.22–5.81)
RDW: 13.6 % (ref 11.5–15.5)
WBC: 11.1 10*3/uL — ABNORMAL HIGH (ref 4.0–10.5)
nRBC: 0 % (ref 0.0–0.2)

## 2019-07-12 LAB — BASIC METABOLIC PANEL
Anion gap: 10 (ref 5–15)
BUN: 13 mg/dL (ref 6–20)
CO2: 25 mmol/L (ref 22–32)
Calcium: 8.9 mg/dL (ref 8.9–10.3)
Chloride: 105 mmol/L (ref 98–111)
Creatinine, Ser: 1.05 mg/dL (ref 0.61–1.24)
GFR calc Af Amer: >60 mL/min (ref >60)
GFR calc non Af Amer: >60 mL/min (ref >60)
Glucose, Bld: 105 mg/dL — ABNORMAL HIGH (ref 70–99)
Potassium: 3.8 mmol/L (ref 3.5–5.1)
Sodium: 140 mmol/L (ref 135–145)

## 2019-07-12 LAB — SARS CORONAVIRUS 2 BY RT PCR (HOSPITAL ORDER, PERFORMED IN ~~LOC~~ HOSPITAL LAB): SARS Coronavirus 2: NEGATIVE

## 2019-07-12 MED ORDER — ALBUTEROL SULFATE HFA 108 (90 BASE) MCG/ACT IN AERS
2.0000 | INHALATION_SPRAY | Freq: Once | RESPIRATORY_TRACT | Status: AC
  Administered 2019-07-12: 2 via RESPIRATORY_TRACT
  Filled 2019-07-12: qty 6.7

## 2019-07-12 NOTE — Discharge Instructions (Signed)
Use the inhaler as needed every 4 hours for symptomatic relief of shortness of breath.   Please follow up with your doctor this week for recheck if symptoms persist. And please return to the emergency department if symptoms worsen.

## 2019-07-12 NOTE — ED Triage Notes (Signed)
Pt reporting she has had a productive cough for just over a week. He has had pain on the right side of his body and feels like it is difficult to take a deep breath for about 1.5 days.

## 2019-07-12 NOTE — ED Provider Notes (Signed)
[facility] DEPT Provider Note   CSN: [id number] Arrival date & time: 07/12/19  0230     History Chief Complaint  Patient presents with  . Shortness of Breath    Jimmy Fields is a 29 y.o. male.  Patient with a history of HTN, HIV (undetectable viral load) presents with 2 days of mild cough, right sided chest tightness and mild DOE. He states inspiration sometimes feels difficult. No fever, nausea, congestion. No known COVID contacts. He reports having recently been treated for a sinus infection with 2 regimens of antibiotics, completing therapy about 2 weeks ago. No recurrent symptoms that he associates with sinus infection. No nausea or vomiting. He is a smoker. No history of asthma or inhaler use.   The history is provided by the patient. No language interpreter was used.  Shortness of Breath Associated symptoms: cough   Associated symptoms: no abdominal pain, no fever and no sore throat        Past Medical History:  Diagnosis Date  . HIV (human immunodeficiency virus infection) ([place])   . Hypertension     Patient Active Problem List   Diagnosis Date Noted  . HIV (human immunodeficiency virus infection) ([place])   . Hypertension   . Acute appendicitis s/p lap appendectomy 10/31/2017 10/25/2017    Past Surgical History:  Procedure Laterality Date  . LAPAROSCOPIC APPENDECTOMY N/A 10/31/2017   Procedure: APPENDECTOMY LAPAROSCOPIC;  Surgeon: Excell Seltzer, MD;  Location: [facility] ORS;  Service: General;  Laterality: N/A;       Family History  Problem Relation Age of Onset  . Heart failure Father     Social History   Tobacco Use  . Smoking status: Former Smoker    Types: Cigarettes  . Smokeless tobacco: Never Used  Vaping Use  . Vaping Use: Never used  Substance Use Topics  . Alcohol use: Yes  . Drug use: Yes    Frequency: 14.0 times per week    Types: Marijuana    Comment: 3-4 times a week    Home Medications Prior to  Admission medications   Medication Sig Start Date End Date Taking? Authorizing Provider  acetaminophen (TYLENOL) 500 MG tablet Take 500 mg by mouth every 6 (six) hours as needed.   Yes [provider]  amLODipine (NORVASC) 5 MG tablet Take 5 mg by mouth daily.  10/04/17  Yes [provider]  aspirin EC 81 MG tablet Take 81 mg by mouth daily.    Yes [provider]  BIKTARVY 50-200-25 MG TABS tablet Take 1 tablet by mouth daily.  09/29/17  Yes [provider]  cholecalciferol (VITAMIN D) 1000 units tablet Take 1,000 Units by mouth daily.   Yes [provider]  GARLIC PO Take 1 capsule by mouth daily.   Yes [provider]  guaiFENesin (MUCINEX) 600 MG 12 hr tablet Take 600 mg by mouth daily.   Yes [provider]  Multiple Vitamins-Minerals (MULTIVITAMIN ADULT PO) Take 1 tablet by mouth daily.   Yes [provider]  Omega-3 Fatty Acids (FISH OIL) 1000 MG CAPS Take 1 capsule by mouth daily.   Yes [provider]  omeprazole (PRILOSEC) 40 MG capsule Take 40 mg by mouth daily.   Yes [provider]  ondansetron (ZOFRAN) 8 MG tablet Take 8 mg by mouth every 8 (eight) hours as needed for nausea or vomiting.  10/04/17  Yes [provider]  Oxycodone HCl 10 MG TABS Take 1 tablet (10 mg total)  by mouth every 6 (six) hours as needed. Patient not taking: Reported on 07/12/2019 11/01/17   Mattie Marlin L, PA  polyethylene glycol [facility]) packet Take 17 g by mouth daily. Patient not taking: Reported on 07/12/2019 11/01/17   Jerre Simon, PA    Allergies    Patient has no known allergies.  Review of Systems   Review of Systems  Constitutional: Negative for chills and fever.  HENT: Negative.  Negative for congestion, sinus pain and sore throat.        No loss of taste or smell.  Respiratory: Positive for cough, chest tightness and shortness of breath.        See HPI.  Cardiovascular: Negative.     Gastrointestinal: Negative.  Negative for abdominal pain and nausea.  Genitourinary: Negative.   Musculoskeletal: Negative.  Negative for myalgias.  Skin: Negative.   Neurological: Negative.     Physical Exam Updated Vital Signs BP (!) 155/92   Pulse 79   Temp 99.6 F (37.6 C) (Oral)   Resp 16   Ht 5\' 10"  (1.778 m)   Wt (!) 163.3 kg   SpO2 95%   BMI 51.65 kg/m   Physical Exam Vitals and nursing note reviewed.  Constitutional:      Appearance: He is well-developed. He is obese.  HENT:     Head: Normocephalic.  Cardiovascular:     Rate and Rhythm: Normal rate and regular rhythm.     Heart sounds: No murmur heard.   Pulmonary:     Effort: Pulmonary effort is normal.     Breath sounds: Normal breath sounds. No wheezing, rhonchi or rales.  Chest:     Chest wall: Tenderness (Mild right upper chest tenderness. ) present.  Abdominal:     General: Bowel sounds are normal.     Palpations: Abdomen is soft.     Tenderness: There is no abdominal tenderness. There is no guarding or rebound.  Musculoskeletal:        General: Normal range of motion.     Cervical back: Normal range of motion and neck supple.     Right lower leg: No edema.     Left lower leg: No edema.  Skin:    General: Skin is warm and dry.     Findings: No rash.  Neurological:     Mental Status: He is alert and oriented to person, place, and time.     ED Results / Procedures / Treatments   Labs (all labs ordered are listed, but only abnormal results are displayed) Labs Reviewed  CBC WITH DIFFERENTIAL/PLATELET - Abnormal; Notable for the following components:      Result Value   WBC 11.1 (*)    Monocytes Absolute 1.5 (*)    All other components within normal limits  BASIC METABOLIC PANEL - Abnormal; Notable for the following components:   Glucose, Bld 105 (*)    All other components within normal limits  SARS CORONAVIRUS 2 BY RT PCR (HOSPITAL ORDER, PERFORMED IN [facility])   Results  for orders placed or performed during the hospital encounter of 07/12/19  CBC with Differential  Result Value Ref Range   WBC 11.1 (H) 4.0 - 10.5 K/uL   RBC 5.33 4.22 - 5.81 MIL/uL   Hemoglobin 14.1 13.0 - 17.0 g/dL   HCT 07/14/19 39 - 52 %   MCV 84.4 80.0 - 100.0 fL   MCH 26.5 26.0 - 34.0 pg   MCHC 31.3 30.0 - 36.0  g/dL   RDW 32.2 02.5 - 42.7 %   Platelets 228 150 - 400 K/uL   nRBC 0.0 0.0 - 0.2 %   Neutrophils Relative % 61 %   Neutro Abs 6.7 1.7 - 7.7 K/uL   Lymphocytes Relative 22 %   Lymphs Abs 2.5 0.7 - 4.0 K/uL   Monocytes Relative 14 %   Monocytes Absolute 1.5 (H) 0 - 1 K/uL   Eosinophils Relative 3 %   Eosinophils Absolute 0.3 0 - 0 K/uL   Basophils Relative 0 %   Basophils Absolute 0.0 0 - 0 K/uL   Immature Granulocytes 0 %   Abs Immature Granulocytes 0.03 0.00 - 0.07 K/uL  Basic metabolic panel  Result Value Ref Range   Sodium 140 135 - 145 mmol/L   Potassium 3.8 3.5 - 5.1 mmol/L   Chloride 105 98 - 111 mmol/L   CO2 25 22 - 32 mmol/L   Glucose, Bld 105 (H) 70 - 99 mg/dL   BUN 13 6 - 20 mg/dL   Creatinine, Ser 0.62 0.61 - 1.24 mg/dL   Calcium 8.9 8.9 - 37.6 mg/dL   GFR calc non Af Amer >60 >60 mL/min   GFR calc Af Amer >60 >60 mL/min   Anion gap 10 5 - 15     EKG None  Radiology DG Chest Port 1 View  Result Date: 07/12/2019 CLINICAL DATA:  Productive cough for the past week. EXAM: PORTABLE CHEST 1 VIEW COMPARISON:  None. FINDINGS: Examination is degraded due to patient body habitus and portable technique Normal cardiac silhouette and mediastinal contours given AP projection. No discrete focal airspace opacities. No pleural effusion or pneumothorax. No evidence of edema. No acute osseous abnormalities. IMPRESSION: No acute cardiopulmonary disease. Specifically, no evidence of pneumonia on this AP portable examination. Further evaluation with a PA and lateral chest radiograph may be obtained as clinically indicated. Electronically Signed   By: Simonne Come M.D.   On:  07/12/2019 05:05    Procedures Procedures (including critical care time)  Medications Ordered in ED Medications  albuterol (VENTOLIN HFA) 108 (90 Base) MCG/ACT inhaler 2 puff (2 puffs Inhalation Given 07/12/19 0444)    ED Course  I have reviewed the triage vital signs and the nursing notes.  Pertinent labs & imaging results that were available during my care of the patient were reviewed by me and considered in my medical decision making (see chart for details).    MDM Rules/Calculators/A&P                          Patient to ED with ss/sxs as per HPI.   He is well appearing, nontoxic, in NAD. VSS. No tachycardia or hypoxia. Lungs CTAB. He ambulated to the bathroom without difficulty or apparent SOB or limitation.   CXR negative. Labs are reassuring. Albuterol inhaler provided relief of his sense of SOB. Will ambulate with pulse ox to insure no drop in oxygenation or onset of symptoms.   Patient has no decrease in O2 saturation when walking. He reports he is less symptomatic than on arrival. He can be discharged home with PCP follow up. Return precautions discussed.   Final Clinical Impression(s) / ED Diagnoses Final diagnoses:  Cough   1. Cough 2. DOE  Rx / DC Orders ED Discharge Orders    None       Elpidio Anis, PA-C 07/12/19 0552    Paula Libra, MD 07/12/19 [phone]

## 2019-11-30 ENCOUNTER — Encounter (HOSPITAL_COMMUNITY): Payer: Self-pay | Admitting: Emergency Medicine

## 2019-11-30 ENCOUNTER — Inpatient Hospital Stay (HOSPITAL_COMMUNITY)
Admission: EM | Admit: 2019-11-30 | Discharge: 2019-12-08 | DRG: 549 | Disposition: A | Discharge status: Home Health | Payer: PRIVATE HEALTH INSURANCE | Attending: Internal Medicine | Admitting: Internal Medicine

## 2019-11-30 ENCOUNTER — Other Ambulatory Visit: Payer: Self-pay

## 2019-11-30 ENCOUNTER — Emergency Department (HOSPITAL_COMMUNITY): Payer: PRIVATE HEALTH INSURANCE

## 2019-11-30 DIAGNOSIS — R112 Nausea with vomiting, unspecified: Secondary | ICD-10-CM

## 2019-11-30 DIAGNOSIS — Z7982 Long term (current) use of aspirin: Secondary | ICD-10-CM

## 2019-11-30 DIAGNOSIS — A5486 Gonococcal sepsis: Secondary | ICD-10-CM

## 2019-11-30 DIAGNOSIS — M659 Synovitis and tenosynovitis, unspecified: Secondary | ICD-10-CM | POA: Diagnosis present

## 2019-11-30 DIAGNOSIS — D729 Disorder of white blood cells, unspecified: Secondary | ICD-10-CM

## 2019-11-30 DIAGNOSIS — M25572 Pain in left ankle and joints of left foot: Secondary | ICD-10-CM | POA: Diagnosis not present

## 2019-11-30 DIAGNOSIS — B20 Human immunodeficiency virus [HIV] disease: Secondary | ICD-10-CM | POA: Diagnosis not present

## 2019-11-30 DIAGNOSIS — M159 Polyosteoarthritis, unspecified: Secondary | ICD-10-CM | POA: Diagnosis present

## 2019-11-30 DIAGNOSIS — A5442 Gonococcal arthritis: Principal | ICD-10-CM | POA: Diagnosis present

## 2019-11-30 DIAGNOSIS — R21 Rash and other nonspecific skin eruption: Secondary | ICD-10-CM | POA: Diagnosis present

## 2019-11-30 DIAGNOSIS — M79672 Pain in left foot: Secondary | ICD-10-CM | POA: Diagnosis not present

## 2019-11-30 DIAGNOSIS — I10 Essential (primary) hypertension: Secondary | ICD-10-CM

## 2019-11-30 DIAGNOSIS — R6510 Systemic inflammatory response syndrome (SIRS) of non-infectious origin without acute organ dysfunction: Secondary | ICD-10-CM

## 2019-11-30 DIAGNOSIS — M2550 Pain in unspecified joint: Secondary | ICD-10-CM | POA: Diagnosis present

## 2019-11-30 DIAGNOSIS — Z79899 Other long term (current) drug therapy: Secondary | ICD-10-CM

## 2019-11-30 DIAGNOSIS — Z20822 Contact with and (suspected) exposure to covid-19: Secondary | ICD-10-CM | POA: Diagnosis present

## 2019-11-30 DIAGNOSIS — Z6841 Body Mass Index (BMI) 40.0 and over, adult: Secondary | ICD-10-CM

## 2019-11-30 DIAGNOSIS — M25579 Pain in unspecified ankle and joints of unspecified foot: Secondary | ICD-10-CM

## 2019-11-30 DIAGNOSIS — Z87891 Personal history of nicotine dependence: Secondary | ICD-10-CM

## 2019-11-30 DIAGNOSIS — Z8249 Family history of ischemic heart disease and other diseases of the circulatory system: Secondary | ICD-10-CM

## 2019-11-30 DIAGNOSIS — Z91013 Allergy to seafood: Secondary | ICD-10-CM

## 2019-11-30 DIAGNOSIS — Z21 Asymptomatic human immunodeficiency virus [HIV] infection status: Secondary | ICD-10-CM | POA: Diagnosis present

## 2019-11-30 DIAGNOSIS — M009 Pyogenic arthritis, unspecified: Secondary | ICD-10-CM | POA: Diagnosis present

## 2019-11-30 DIAGNOSIS — Z8619 Personal history of other infectious and parasitic diseases: Secondary | ICD-10-CM

## 2019-11-30 LAB — HEPATIC FUNCTION PANEL
ALT: 20 U/L (ref 0–44)
AST: 17 U/L (ref 15–41)
Albumin: 3.7 g/dL (ref 3.5–5.0)
Alkaline Phosphatase: 64 U/L (ref 38–126)
Bilirubin, Direct: 0.2 mg/dL (ref 0.0–0.2)
Indirect Bilirubin: 1.1 mg/dL — ABNORMAL HIGH (ref 0.3–0.9)
Total Bilirubin: 1.3 mg/dL — ABNORMAL HIGH (ref 0.3–1.2)
Total Protein: 7.4 g/dL (ref 6.5–8.1)

## 2019-11-30 LAB — C-REACTIVE PROTEIN: CRP: 12 mg/dL — ABNORMAL HIGH (ref <1.0)

## 2019-11-30 LAB — CBC WITH DIFFERENTIAL/PLATELET
Abs Immature Granulocytes: 0.15 10*3/uL — ABNORMAL HIGH (ref 0.00–0.07)
Basophils Absolute: 0.1 10*3/uL (ref 0.0–0.1)
Basophils Relative: 0 %
Eosinophils Absolute: 0 10*3/uL (ref 0.0–0.5)
Eosinophils Relative: 0 %
HCT: 47.7 % (ref 39.0–52.0)
Hemoglobin: 15.2 g/dL (ref 13.0–17.0)
Immature Granulocytes: 1 %
Lymphocytes Relative: 7 %
Lymphs Abs: 1.6 10*3/uL (ref 0.7–4.0)
MCH: 26.3 pg (ref 26.0–34.0)
MCHC: 31.9 g/dL (ref 30.0–36.0)
MCV: 82.5 fL (ref 80.0–100.0)
Monocytes Absolute: 2.5 10*3/uL — ABNORMAL HIGH (ref 0.1–1.0)
Monocytes Relative: 11 %
Neutro Abs: 18.8 10*3/uL — ABNORMAL HIGH (ref 1.7–7.7)
Neutrophils Relative %: 81 %
Platelets: 276 10*3/uL (ref 150–400)
RBC: 5.78 MIL/uL (ref 4.22–5.81)
RDW: 13.4 % (ref 11.5–15.5)
WBC: 23.1 10*3/uL — ABNORMAL HIGH (ref 4.0–10.5)
nRBC: 0 % (ref 0.0–0.2)

## 2019-11-30 LAB — URINALYSIS, ROUTINE W REFLEX MICROSCOPIC
Bilirubin Urine: NEGATIVE
Glucose, UA: NEGATIVE mg/dL
Hgb urine dipstick: NEGATIVE
Ketones, ur: 5 mg/dL — AB
Leukocytes,Ua: NEGATIVE
Nitrite: NEGATIVE
Protein, ur: 30 mg/dL — AB
Specific Gravity, Urine: 1.028 (ref 1.005–1.030)
pH: 6 (ref 5.0–8.0)

## 2019-11-30 LAB — RPR
RPR Ser Ql: REACTIVE — AB
RPR Titer: 1:1 {titer}

## 2019-11-30 LAB — BASIC METABOLIC PANEL
Anion gap: 13 (ref 5–15)
BUN: 12 mg/dL (ref 6–20)
CO2: 25 mmol/L (ref 22–32)
Calcium: 9.4 mg/dL (ref 8.9–10.3)
Chloride: 98 mmol/L (ref 98–111)
Creatinine, Ser: 1.17 mg/dL (ref 0.61–1.24)
GFR, Estimated: >60 mL/min (ref >60)
Glucose, Bld: 130 mg/dL — ABNORMAL HIGH (ref 70–99)
Potassium: 3.6 mmol/L (ref 3.5–5.1)
Sodium: 136 mmol/L (ref 135–145)

## 2019-11-30 LAB — CBC
HCT: 44.5 % (ref 39.0–52.0)
Hemoglobin: 14.1 g/dL (ref 13.0–17.0)
MCH: 26 pg (ref 26.0–34.0)
MCHC: 31.7 g/dL (ref 30.0–36.0)
MCV: 82.1 fL (ref 80.0–100.0)
Platelets: 244 10*3/uL (ref 150–400)
RBC: 5.42 MIL/uL (ref 4.22–5.81)
RDW: 13.3 % (ref 11.5–15.5)
WBC: 24.2 10*3/uL — ABNORMAL HIGH (ref 4.0–10.5)
nRBC: 0 % (ref 0.0–0.2)

## 2019-11-30 LAB — RESPIRATORY PANEL BY RT PCR (FLU A&B, COVID)
Influenza A by PCR: NEGATIVE
Influenza B by PCR: NEGATIVE
SARS Coronavirus 2 by RT PCR: NEGATIVE

## 2019-11-30 LAB — URIC ACID: Uric Acid, Serum: 7.3 mg/dL (ref 3.7–8.6)

## 2019-11-30 LAB — SEDIMENTATION RATE: Sed Rate: 5 mm/hr (ref 0–16)

## 2019-11-30 LAB — CREATININE, SERUM
Creatinine, Ser: 1 mg/dL (ref 0.61–1.24)
GFR, Estimated: >60 mL/min (ref >60)

## 2019-11-30 LAB — LACTIC ACID, PLASMA: Lactic Acid, Venous: 0.9 mmol/L (ref 0.5–1.9)

## 2019-11-30 MED ORDER — FENTANYL CITRATE (PF) 100 MCG/2ML IJ SOLN
100.0000 ug | Freq: Once | INTRAMUSCULAR | Status: AC | PRN
  Administered 2019-11-30: 100 ug via INTRAVENOUS
  Filled 2019-11-30: qty 2

## 2019-11-30 MED ORDER — ONDANSETRON HCL 4 MG/2ML IJ SOLN
4.0000 mg | Freq: Four times a day (QID) | INTRAMUSCULAR | Status: DC | PRN
  Administered 2019-12-01 – 2019-12-07 (×11): 4 mg via INTRAVENOUS
  Filled 2019-11-30 (×12): qty 2

## 2019-11-30 MED ORDER — SODIUM CHLORIDE 0.9 % IV BOLUS
1000.0000 mL | Freq: Once | INTRAVENOUS | Status: AC
  Administered 2019-11-30: 1000 mL via INTRAVENOUS

## 2019-11-30 MED ORDER — KETOROLAC TROMETHAMINE 15 MG/ML IJ SOLN
15.0000 mg | Freq: Once | INTRAMUSCULAR | Status: AC
  Administered 2019-11-30: 15 mg via INTRAVENOUS
  Filled 2019-11-30: qty 1

## 2019-11-30 MED ORDER — MORPHINE SULFATE (PF) 2 MG/ML IV SOLN
2.0000 mg | INTRAVENOUS | Status: DC | PRN
  Administered 2019-12-01 – 2019-12-03 (×8): 2 mg via INTRAVENOUS
  Filled 2019-11-30 (×8): qty 1

## 2019-11-30 MED ORDER — SODIUM CHLORIDE 0.9 % IV SOLN
INTRAVENOUS | Status: DC | PRN
  Administered 2019-11-30 – 2019-12-03 (×3): 1000 mL via INTRAVENOUS
  Administered 2019-12-08: 500 mL via INTRAVENOUS

## 2019-11-30 MED ORDER — ASPIRIN EC 81 MG PO TBEC
81.0000 mg | DELAYED_RELEASE_TABLET | Freq: Every day | ORAL | Status: DC
  Administered 2019-11-30 – 2019-12-08 (×8): 81 mg via ORAL
  Filled 2019-11-30 (×9): qty 1

## 2019-11-30 MED ORDER — HYDROCODONE-ACETAMINOPHEN 5-325 MG PO TABS
1.0000 | ORAL_TABLET | ORAL | Status: DC | PRN
  Administered 2019-11-30 – 2019-12-06 (×21): 2 via ORAL
  Administered 2019-12-07: 1 via ORAL
  Administered 2019-12-07: 2 via ORAL
  Filled 2019-11-30: qty 1
  Filled 2019-11-30 (×22): qty 2

## 2019-11-30 MED ORDER — ENOXAPARIN SODIUM 40 MG/0.4ML ~~LOC~~ SOLN
40.0000 mg | SUBCUTANEOUS | Status: DC
  Administered 2019-11-30: 40 mg via SUBCUTANEOUS
  Filled 2019-11-30: qty 0.4

## 2019-11-30 MED ORDER — LABETALOL HCL 5 MG/ML IV SOLN
10.0000 mg | Freq: Once | INTRAVENOUS | Status: AC
  Administered 2019-11-30: 10 mg via INTRAVENOUS
  Filled 2019-11-30: qty 4

## 2019-11-30 MED ORDER — SODIUM CHLORIDE 0.9 % IV SOLN
2.0000 g | INTRAVENOUS | Status: DC
  Administered 2019-11-30 – 2019-12-08 (×9): 2 g via INTRAVENOUS
  Filled 2019-11-30 (×2): qty 2
  Filled 2019-11-30: qty 20
  Filled 2019-11-30: qty 2
  Filled 2019-11-30 (×3): qty 20
  Filled 2019-11-30 (×2): qty 2

## 2019-11-30 MED ORDER — ONDANSETRON HCL 4 MG/2ML IJ SOLN
4.0000 mg | Freq: Once | INTRAMUSCULAR | Status: AC
  Administered 2019-11-30: 4 mg via INTRAVENOUS
  Filled 2019-11-30: qty 2

## 2019-11-30 MED ORDER — BICTEGRAVIR-EMTRICITAB-TENOFOV 50-200-25 MG PO TABS
1.0000 | ORAL_TABLET | Freq: Every day | ORAL | Status: DC
  Administered 2019-11-30 – 2019-12-08 (×9): 1 via ORAL
  Filled 2019-11-30 (×9): qty 1

## 2019-11-30 MED ORDER — ACETAMINOPHEN 325 MG PO TABS
650.0000 mg | ORAL_TABLET | Freq: Four times a day (QID) | ORAL | Status: DC | PRN
  Administered 2019-12-03: 650 mg via ORAL
  Filled 2019-11-30: qty 2

## 2019-11-30 MED ORDER — AMLODIPINE BESYLATE 10 MG PO TABS
10.0000 mg | ORAL_TABLET | Freq: Every day | ORAL | Status: DC
  Administered 2019-11-30 – 2019-12-08 (×9): 10 mg via ORAL
  Filled 2019-11-30 (×10): qty 1

## 2019-11-30 MED ORDER — PANTOPRAZOLE SODIUM 40 MG PO TBEC
40.0000 mg | DELAYED_RELEASE_TABLET | Freq: Every day | ORAL | Status: DC
  Administered 2019-11-30 – 2019-12-08 (×9): 40 mg via ORAL
  Filled 2019-11-30 (×9): qty 1

## 2019-11-30 MED ORDER — ACETAMINOPHEN 650 MG RE SUPP: 650.0000 mg | Freq: Four times a day (QID) | RECTAL | Status: DC | PRN

## 2019-11-30 MED ORDER — POLYETHYLENE GLYCOL 3350 17 G PO PACK: 17.0000 g | PACK | Freq: Every day | ORAL | Status: DC | PRN

## 2019-11-30 NOTE — ED Provider Notes (Signed)
[facility]-EMERGENCY DEPT Provider Note: Lowella Dell, MD, FACEP  CSN: [id number] MRN: [id number] ARRIVAL: 11/30/19 at 0201 ROOM: WA01/[facility]01   CHIEF COMPLAINT  Foot Pain   HISTORY OF PRESENT ILLNESS  11/30/19 3:21 AM Jimmy Fields is a 29 y.o. male with HIV disease on Biktarvy.  He is here with left foot pain that began yesterday and has gradually worsened.  The pain is located primarily dorsally and laterally.  He denies any inciting injury.  He rates the pain is a 10 out of 10 and he is unable to bear weight on it.  He has no history of gout.  He has also been sick for the last 2 days with fever, nausea and vomiting.  He has had a cough but no loss of taste or smell.   Past Medical History:  Diagnosis Date  . HIV (human immunodeficiency virus infection) ([facility])   . Hypertension     Past Surgical History:  Procedure Laterality Date  . LAPAROSCOPIC APPENDECTOMY N/A 10/31/2017   Procedure: APPENDECTOMY LAPAROSCOPIC;  Surgeon: Glenna Fellows, MD;  Location: [facility];  Service: General;  Laterality: N/A;    Family History  Problem Relation Age of Onset  . Heart failure Father     Social History   Tobacco Use  . Smoking status: Former Smoker    Types: Cigarettes  . Smokeless tobacco: Never Used  Vaping Use  . Vaping Use: Never used  Substance Use Topics  . Alcohol use: Yes  . Drug use: Yes    Frequency: 14.0 times per week    Types: Marijuana    Comment: 3-4 times a week    Prior to Admission medications   Medication Sig Start Date End Date Taking? Authorizing Provider  acetaminophen (TYLENOL) 500 MG tablet Take 500 mg by mouth every 6 (six) hours as needed.    [provider]  amLODipine (NORVASC) 5 MG tablet Take 5 mg by mouth daily.  10/04/17   [provider]  aspirin EC 81 MG tablet Take 81 mg by mouth daily.     [provider]  BIKTARVY 50-200-25 MG TABS tablet Take 1 tablet by mouth daily.  09/29/17   [provider]    cholecalciferol (VITAMIN D) 1000 units tablet Take 1,000 Units by mouth daily.    [provider]  GARLIC PO Take 1 capsule by mouth daily.    [provider]  guaiFENesin (MUCINEX) 600 MG 12 hr tablet Take 600 mg by mouth daily.    [provider]  Multiple Vitamins-Minerals (MULTIVITAMIN ADULT PO) Take 1 tablet by mouth daily.    [provider]  Omega-3 Fatty Acids (FISH OIL) 1000 MG CAPS Take 1 capsule by mouth daily.    [provider]  omeprazole (PRILOSEC) 40 MG capsule Take 40 mg by mouth daily.    [provider]  ondansetron (ZOFRAN) 8 MG tablet Take 8 mg by mouth every 8 (eight) hours as needed for nausea or vomiting.  10/04/17   [provider]  Oxycodone HCl 10 MG TABS Take 1 tablet (10 mg total) by mouth every 6 (six) hours as needed. Patient not taking: Reported on 07/12/2019 11/01/17   Mattie Marlin L, PA  polyethylene glycol [facility]) packet Take 17 g by mouth daily. Patient not taking: Reported on 07/12/2019 11/01/17   Jerre Simon, PA    Allergies Patient has no known allergies.   REVIEW OF SYSTEMS  Negative except as noted here or in the History  of Present Illness.   PHYSICAL EXAMINATION  Initial Vital Signs Blood pressure (!) 144/102, pulse (!) 103, temperature 100 F (37.8 C), temperature source Oral, resp. rate 18, SpO2 98 %.  Examination General: Well-developed, well-nourished male in no acute distress; appearance consistent with age of record HENT: normocephalic; atraumatic Eyes: pupils equal, round and reactive to light; extraocular muscles intact Neck: supple Heart: regular rate and rhythm Lungs: clear to auscultation bilaterally Abdomen: soft; nondistended; nontender; bowel sounds present Extremities: No deformity; pulses normal; tenderness of left foot without swelling, erythema or warmth Neurologic: Awake, alert and oriented; motor function intact in all extremities and symmetric; no  facial droop Skin: Warm and dry Psychiatric: Flat affect   RESULTS  Summary of this visit's results, reviewed and interpreted by myself:   EKG Interpretation  Date/Time:    Ventricular Rate:    PR Interval:    QRS Duration:   QT Interval:    QTC Calculation:   R Axis:     Text Interpretation:        Laboratory Studies: Results for orders placed or performed during the hospital encounter of 11/30/19 (from the past 24 hour(s))  Urinalysis, Routine w reflex microscopic     Status: Abnormal   Collection Time: 11/30/19  4:36 AM  Result Value Ref Range   Color, Urine YELLOW YELLOW   APPearance CLEAR CLEAR   Specific Gravity, Urine 1.028 1.005 - 1.030   pH 6.0 5.0 - 8.0   Glucose, UA NEGATIVE NEGATIVE mg/dL   Hgb urine dipstick NEGATIVE NEGATIVE   Bilirubin Urine NEGATIVE NEGATIVE   Ketones, ur 5 (A) NEGATIVE mg/dL   Protein, ur 30 (A) NEGATIVE mg/dL   Nitrite NEGATIVE NEGATIVE   Leukocytes,Ua NEGATIVE NEGATIVE   RBC / HPF 0-5 0 - 5 RBC/hpf   WBC, UA 0-5 0 - 5 WBC/hpf   Bacteria, UA RARE (A) NONE SEEN   Squamous Epithelial / LPF 0-5 0 - 5   Mucus PRESENT   CBC with Differential/Platelet     Status: Abnormal   Collection Time: 11/30/19  4:36 AM  Result Value Ref Range   WBC 23.1 (H) 4.0 - 10.5 K/uL   RBC 5.78 4.22 - 5.81 MIL/uL   Hemoglobin 15.2 13.0 - 17.0 g/dL   HCT 15.7 39 - 52 %   MCV 82.5 80.0 - 100.0 fL   MCH 26.3 26.0 - 34.0 pg   MCHC 31.9 30.0 - 36.0 g/dL   RDW 26.2 03.5 - 59.7 %   Platelets 276 150 - 400 K/uL   nRBC 0.0 0.0 - 0.2 %   Neutrophils Relative % 81 %   Neutro Abs 18.8 (H) 1.7 - 7.7 K/uL   Lymphocytes Relative 7 %   Lymphs Abs 1.6 0.7 - 4.0 K/uL   Monocytes Relative 11 %   Monocytes Absolute 2.5 (H) 0.1 - 1.0 K/uL   Eosinophils Relative 0 %   Eosinophils Absolute 0.0 0.0 - 0.5 K/uL   Basophils Relative 0 %   Basophils Absolute 0.1 0.0 - 0.1 K/uL   Immature Granulocytes 1 %   Abs Immature Granulocytes 0.15 (H) 0.00 - 0.07 K/uL  Basic  metabolic panel     Status: Abnormal   Collection Time: 11/30/19  4:36 AM  Result Value Ref Range   Sodium 136 135 - 145 mmol/L   Potassium 3.6 3.5 - 5.1 mmol/L   Chloride 98 98 - 111 mmol/L   CO2 25 22 - 32 mmol/L   Glucose, Bld 130 (H)  70 - 99 mg/dL   BUN 12 6 - 20 mg/dL   Creatinine, Ser 4.46 0.61 - 1.24 mg/dL   Calcium 9.4 8.9 - 28.6 mg/dL   GFR, Estimated >38 >17 mL/min   Anion gap 13 5 - 15  Respiratory Panel by RT PCR (Flu A&B, Covid) -     Status: None   Collection Time: 11/30/19  4:36 AM   Specimen: Nasopharyngeal  Result Value Ref Range   SARS Coronavirus 2 by RT PCR NEGATIVE NEGATIVE   Influenza A by PCR NEGATIVE NEGATIVE   Influenza B by PCR NEGATIVE NEGATIVE   Imaging Studies: DG Foot Complete Left  Result Date: 11/30/2019 CLINICAL DATA:  Left foot pain. EXAM: LEFT FOOT - COMPLETE 3+ VIEW COMPARISON:  None. FINDINGS: There is no evidence of fracture or dislocation. There is no evidence of arthropathy or other focal bone abnormality. Soft tissues are unremarkable. IMPRESSION: Negative. Electronically Signed   By: Aram Candela M.D.   On: 11/30/2019 03:02    ED COURSE and MDM  Nursing notes, initial and subsequent vitals signs, including pulse oximetry, reviewed and interpreted by myself.  Vitals:   11/30/19 0212 11/30/19 0505 11/30/19 0530  BP: (!) 144/102 (!) 164/115 (!) 167/91  Pulse: (!) 103 93 96  Resp: 18    Temp: 100 F (37.8 C)    TempSrc: Oral    SpO2: 98% 93% 91%   Medications  sodium chloride 0.9 % bolus 1,000 mL (0 mLs Intravenous Stopped 11/30/19 0610)  ondansetron (ZOFRAN) injection 4 mg (4 mg Intravenous Given 11/30/19 0438)  ketorolac (TORADOL) 15 MG/ML injection 15 mg (15 mg Intravenous Given 11/30/19 0438)  fentaNYL (SUBLIMAZE) injection 100 mcg (100 mcg Intravenous Given 11/30/19 0516)   6:29 AM The foot is not obviously septic in appearance.  He could have gout and a uric acid level is pending.  CRP and sed rate are also pending.  After  discussion with Dr. Julian Reil of the hospitalist service will have him admitted for further work-up.   PROCEDURES  Procedures   ED DIAGNOSES     ICD-10-CM   1. Left foot pain  M79.672 DG Foot Complete Left    DG Foot Complete Left  2. Neutrophilic leukocytosis  D72.9   3. Nausea and vomiting in adult  R11.2   4. SIRS (systemic inflammatory response syndrome) (HCC)  R65.10        Shawnee Gambone, Jonny Ruiz, MD 11/30/19 [phone]

## 2019-11-30 NOTE — ED Notes (Signed)
Temp 98.5 oral

## 2019-11-30 NOTE — Consult Note (Signed)
[facility]  Total days of antibiotics 1         Reason for Consult: rash and polyarticular arthritis   Referring Physician: segal  Principal Problem:   Acute left ankle pain Active Problems:   HIV (human immunodeficiency virus infection) (HCC)   Essential hypertension   Polyarthralgia    HPI: Jimmy Fields is a 29 y.o. male with history of well controlled hiv disease, on biktarvy, not missing any doses and remote hx of syphilis. he reports having 1 partner, for the past 3 months, they have unprotected sex. He reports new onset fever, nightsweats up to 103F the day prior to admit with new onset left ankle pain and swelling, unable to bear weight, now he is noticing also involving his right ankle, and also affecting right hand, 4th and 5th digit. On exam, has new rash on lower extremities. He has not had this happen to him before. His labs are notable for leukocytosis of 24K.   Past Medical History:  Diagnosis Date  . HIV (human immunodeficiency virus infection) (HCC)   . Hypertension     Allergies:  Allergies  Allergen Reactions  . Fish Allergy Nausea And Vomiting  . Shellfish Allergy Hives and Nausea And Vomiting    MEDICATIONS: . amLODipine  10 mg Oral Daily  . aspirin EC  81 mg Oral Daily  . bictegravir-emtricitabine-tenofovir AF  1 tablet Oral Daily  . enoxaparin (LOVENOX) injection  40 mg Subcutaneous Q24H  . pantoprazole  40 mg Oral Daily    Social History   Tobacco Use  . Smoking status: Former Smoker    Types: Cigarettes  . Smokeless tobacco: Never Used  Vaping Use  . Vaping Use: Never used  Substance Use Topics  . Alcohol use: Yes  . Drug use: Yes    Frequency: 14.0 times per week    Types: Marijuana    Comment: 3-4 times a week    Family History  Problem Relation Age of Onset  . Heart failure Father      Review of Systems  Constitutional: positive for fever, chills, diaphoresis, activity change, appetite change,  fatigue and unexpected weight change.  HENT: Negative for congestion, sore throat, rhinorrhea, sneezing, trouble swallowing and sinus pressure.  Eyes: Negative for photophobia and visual disturbance.  Respiratory: Negative for cough, chest tightness, shortness of breath, wheezing and stridor.  Cardiovascular: Negative for chest pain, palpitations and leg swelling.  Gastrointestinal: Negative for nausea, vomiting, abdominal pain, diarrhea, constipation, blood in stool, abdominal distention and anal bleeding.  Genitourinary: Negative for dysuria, hematuria, flank pain and difficulty urinating.  Musculoskeletal: positive for myalgias, back pain, joint swelling, arthralgias and gait problem.  Skin: Negative for color change, pallor, rash and wound.  Neurological: Negative for dizziness, tremors, weakness and light-headedness.  Hematological: Negative for adenopathy. Does not bruise/bleed easily.  Psychiatric/Behavioral: Negative for behavioral problems, confusion, sleep disturbance, dysphoric mood, decreased concentration and agitation.     OBJECTIVE: Temp:  [98.3 F (36.8 C)-100 F (37.8 C)] 98.3 F (36.8 C) (11/01 1352) Pulse Rate:  [84-103] 84 (11/01 1352) Resp:  [17-20] 18 (11/01 1352) BP: (132-167)/(85-115) 157/101 (11/01 1352) SpO2:  [88 %-100 %] 100 % (11/01 1352) Physical Exam  Constitutional: He is oriented to person, place, and time. He appears well-developed and well-nourished. No distress.  HENT:  Mouth/Throat: Oropharynx is clear and moist. No oropharyngeal exudate.  Cardiovascular: Normal rate, regular rhythm and normal heart sounds. Exam reveals no gallop and no friction  rub.  No murmur heard.  Pulmonary/Chest: Effort normal and breath sounds normal. No respiratory distress. He has no wheezes.  Abdominal: Soft. Bowel sounds are normal. He exhibits no distension. There is no tenderness.  Lymphadenopathy:  He has no cervical adenopathy.  Neurological: He is alert and  oriented to person, place, and time.  Skin: Skin is warm and dry.scattered small macules on feet, and lower legs, spares soles and palms Psychiatric: He has a normal mood and affect. His behavior is normal.     LABS: Results for orders placed or performed during the hospital encounter of 11/30/19 (from the past 48 hour(s))  Urinalysis, Routine w reflex microscopic     Status: Abnormal   Collection Time: 11/30/19  4:36 AM  Result Value Ref Range   Color, Urine YELLOW YELLOW   APPearance CLEAR CLEAR   Specific Gravity, Urine 1.028 1.005 - 1.030   pH 6.0 5.0 - 8.0   Glucose, UA NEGATIVE NEGATIVE mg/dL   Hgb urine dipstick NEGATIVE NEGATIVE   Bilirubin Urine NEGATIVE NEGATIVE   Ketones, ur 5 (A) NEGATIVE mg/dL   Protein, ur 30 (A) NEGATIVE mg/dL   Nitrite NEGATIVE NEGATIVE   Leukocytes,Ua NEGATIVE NEGATIVE   RBC / HPF 0-5 0 - 5 RBC/hpf   WBC, UA 0-5 0 - 5 WBC/hpf   Bacteria, UA RARE (A) NONE SEEN   Squamous Epithelial / LPF 0-5 0 - 5   Mucus PRESENT     Comment: Performed at [facility], [address], [place], [place] [address]  CBC with Differential/Platelet     Status: Abnormal   Collection Time: 11/30/19  4:36 AM  Result Value Ref Range   WBC 23.1 (H) 4.0 - 10.5 K/uL   RBC 5.78 4.22 - 5.81 MIL/uL   Hemoglobin 15.2 13.0 - 17.0 g/dL   HCT 62.8 39 - 52 %   MCV 82.5 80.0 - 100.0 fL   MCH 26.3 26.0 - 34.0 pg   MCHC 31.9 30.0 - 36.0 g/dL   RDW 31.5 17.6 - 16.0 %   Platelets 276 150 - 400 K/uL   nRBC 0.0 0.0 - 0.2 %   Neutrophils Relative % 81 %   Neutro Abs 18.8 (H) 1.7 - 7.7 K/uL   Lymphocytes Relative 7 %   Lymphs Abs 1.6 0.7 - 4.0 K/uL   Monocytes Relative 11 %   Monocytes Absolute 2.5 (H) 0.1 - 1.0 K/uL   Eosinophils Relative 0 %   Eosinophils Absolute 0.0 0.0 - 0.5 K/uL   Basophils Relative 0 %   Basophils Absolute 0.1 0.0 - 0.1 K/uL   Immature Granulocytes 1 %   Abs Immature Granulocytes 0.15 (H) 0.00 - 0.07 K/uL    Comment: Performed at  [facility], [address], [place], [place] [address]  Basic metabolic panel     Status: Abnormal   Collection Time: 11/30/19  4:36 AM  Result Value Ref Range   Sodium 136 135 - 145 mmol/L   Potassium 3.6 3.5 - 5.1 mmol/L   Chloride 98 98 - 111 mmol/L   CO2 25 22 - 32 mmol/L   Glucose, Bld 130 (H) 70 - 99 mg/dL    Comment: Glucose reference range applies only to samples taken after fasting for at least 8 hours.   BUN 12 6 - 20 mg/dL   Creatinine, Ser 6.26 0.61 - 1.24 mg/dL   Calcium 9.4 8.9 - 94.8 mg/dL   GFR, Estimated >54 >62 mL/min  Comment: (NOTE) Calculated using the CKD-EPI Creatinine Equation (2021)    Anion gap 13 5 - 15    Comment: Performed at [facility], [address] [place], [place] [address]  Respiratory Panel by RT PCR (Flu A&B, Covid) -     Status: None   Collection Time: 11/30/19  4:36 AM   Specimen: Nasopharyngeal  Result Value Ref Range   SARS Coronavirus 2 by RT PCR NEGATIVE NEGATIVE    Comment: (NOTE) SARS-CoV-2 target nucleic acids are NOT DETECTED.  The SARS-CoV-2 RNA is generally detectable in upper respiratoy specimens during the acute phase of infection. The lowest concentration of SARS-CoV-2 viral copies this assay can detect is 131 copies/mL. A negative result does not preclude SARS-Cov-2 infection and should not be used as the sole basis for treatment or other patient management decisions. A negative result may occur with  improper specimen collection/handling, submission of specimen other than nasopharyngeal swab, presence of viral mutation(s) within the areas targeted by this assay, and inadequate number of viral copies (<131 copies/mL). A negative result must be combined with clinical observations, patient history, and epidemiological information. The expected result is Negative.  Fact Sheet for Patients:  https://www.moore.com/  Fact Sheet for Healthcare Providers:    https://www.young.biz/  This test is no t yet approved or cleared by the [place] FDA and  has been authorized for detection and/or diagnosis of SARS-CoV-2 by FDA under an Emergency Use Authorization (EUA). This EUA will remain  in effect (meaning this test can be used) for the duration of the COVID-19 declaration under Section 564(b)(1) of the Act, 21 U.S.C. section 360bbb-3(b)(1), unless the authorization is terminated or revoked sooner.     Influenza A by PCR NEGATIVE NEGATIVE   Influenza B by PCR NEGATIVE NEGATIVE    Comment: (NOTE) The Xpert Xpress SARS-CoV-2/FLU/RSV assay is intended as an aid in  the diagnosis of influenza from Nasopharyngeal swab specimens and  should not be used as a sole basis for treatment. Nasal washings and  aspirates are unacceptable for Xpert Xpress SARS-CoV-2/FLU/RSV  testing.  Fact Sheet for Patients: https://www.moore.com/  Fact Sheet for Healthcare Providers: https://www.young.biz/  This test is not yet approved or cleared by the [place] FDA and  has been authorized for detection and/or diagnosis of SARS-CoV-2 by  FDA under an Emergency Use Authorization (EUA). This EUA will remain  in effect (meaning this test can be used) for the duration of the  Covid-19 declaration under Section 564(b)(1) of the Act, 21  U.S.C. section 360bbb-3(b)(1), unless the authorization is  terminated or revoked. Performed at [facility], [address] [place], [place] [address]   Uric acid     Status: None   Collection Time: 11/30/19  4:36 AM  Result Value Ref Range   Uric Acid, Serum 7.3 3.7 - 8.6 mg/dL    Comment: Performed at [facility] [address], [place], [place] [address]  Sedimentation rate     Status: None   Collection Time: 11/30/19  4:36 AM  Result Value Ref Range   Sed Rate 5 0 - 16 mm/hr    Comment: Performed at [facility], [address] [place], [place] [address]  C-reactive protein     Status: Abnormal   Collection Time: 11/30/19  6:20 AM  Result Value Ref Range   CRP 12.0 (H) <1.0 mg/dL    Comment: Performed at [facility], [address] [place],  [place] [id number]  Lactic acid, plasma     Status: None   Collection Time: 11/30/19  6:27 AM  Result Value Ref Range   Lactic Acid, Venous 0.9 0.5 - 1.9 mmol/L    Comment: Performed at [facility], [address] [place], [place] [address]  RPR     Status: Abnormal   Collection Time: 11/30/19  9:43 AM  Result Value Ref Range   RPR Ser Ql Reactive (A) NON REACTIVE    Comment: SENT FOR CONFIRMATION   RPR Titer 1:1     Comment: Performed at [facility], [address] [place], [place] [address]  Hepatic function panel     Status: Abnormal   Collection Time: 11/30/19 10:10 AM  Result Value Ref Range   Total Protein 7.4 6.5 - 8.1 g/dL   Albumin 3.7 3.5 - 5.0 g/dL   AST 17 15 - 41 U/L   ALT 20 0 - 44 U/L   Alkaline Phosphatase 64 38 - 126 U/L   Total Bilirubin 1.3 (H) 0.3 - 1.2 mg/dL   Bilirubin, Direct 0.2 0.0 - 0.2 mg/dL   Indirect Bilirubin 1.1 (H) 0.3 - 0.9 mg/dL    Comment: Performed at [facility], [address] [place], [place] [address]  CBC     Status: Abnormal   Collection Time: 11/30/19 10:10 AM  Result Value Ref Range   WBC 24.2 (H) 4.0 - 10.5 K/uL   RBC 5.42 4.22 - 5.81 MIL/uL   Hemoglobin 14.1 13.0 - 17.0 g/dL   HCT 29.544.5 39 - 52 %   MCV 82.1 80.0 - 100.0 fL   MCH 26.0 26.0 - 34.0 pg   MCHC 31.7 30.0 - 36.0 g/dL   RDW 62.113.3 30.811.5 - 65.715.5 %   Platelets 244 150 - 400 K/uL   nRBC 0.0 0.0 - 0.2 %    Comment: Performed at [facility], [address] [place], [place] [address]  Creatinine, serum     Status: None   Collection Time: 11/30/19 10:10 AM  Result Value Ref Range   Creatinine, Ser 1.00 0.61 - 1.24 mg/dL   GFR, Estimated  >29>60 >52>60 mL/min    Comment: (NOTE) Calculated using the CKD-EPI Creatinine Equation (2021) Performed at [facility], [address] [place], [place] [address]     MICRO: reviewed IMAGING: DG Foot Complete Left  Result Date: 11/30/2019 CLINICAL DATA:  Left foot pain. EXAM: LEFT FOOT - COMPLETE 3+ VIEW COMPARISON:  None. FINDINGS: There is no evidence of fracture or dislocation. There is no evidence of arthropathy or other focal bone abnormality. Soft tissues are unremarkable. IMPRESSION: Negative. Electronically Signed   By: Aram Candelahaddeus  Houston M.D.   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

## 2019-11-30 NOTE — ED Triage Notes (Signed)
L foot pain starting today. States that it has progressed throughout the day. Also reports that he has been sick for the last 2 days with fever, N/V and has been treating it at home with tylenol. He is unable to bear weight at this time. Hx HIV.

## 2019-11-30 NOTE — H&P (Addendum)
History and Physical        Hospital Admission Note Date: 12/01/2019  Patient name: Jimmy Fields Medical record number: [id number] Date of birth: 1990/05/23 Age: 29 y.o. Gender: male  PCP: Patient, No Pcp Per  Patient coming from: home  At baseline, ambulates: independently  Chief Complaint    Chief Complaint  Patient presents with  . Foot Pain      HPI:   This is a 29 year old male with past medical history of HIV on Biktarvy follows at [facility], syphilis s/p treatment, hypertension who presented with a left foot pain x1 day which has gradually been worsening.  Unable to bear weight.  Patient states that he was in his usual state of health until yesterday when he was leaving the [employer] store and had sudden onset of left ankle pain and inability to bear weight.  Admits to fevers up to 103 over the past 48 hours which have been declining and noted nausea, vomiting and a cough to the ED doc.  Admits to having STIs in the past including syphilis and was worked up for gonorrhea in the past but not sure if he was potitive or treated.  Also admits to some discomfort of his right elbow, wrist and right digits as well as right ankle pain.  Denies any trauma to his ankle.  No noted erythema or warmth.  States that he has been dating a new partner for the past 2 months but also had a sexual partner before that but does not believe that either of them have any symptoms.  Admits to intermittent left and right-sided chest pain.  He was unaware of any of the rashes on his feet noted during my exam.  Denies any penile discharge or urinary complaints.  He denies any known personal or family history of any rheumatologic disorders.  ED Course: Low-grade fever 100 F, HR 103, hypertensive (164/115).  Notable labs: WBC 23, CRP 12.  Left foot x-ray unremarkable.  He was given Toradol and Zofran and 1 L  NS bolus.  Vitals:   12/01/19 0617 12/01/19 0654  BP: (!) 165/109 (!) 156/94  Pulse: 84 78  Resp: 17   Temp: 99 F (37.2 C)   SpO2: 99%      Review of Systems:  Review of Systems  All other systems reviewed and are negative.   Medical/Social/Family History   Past Medical History: Past Medical History:  Diagnosis Date  . HIV (human immunodeficiency virus infection) (HCC)   . Hypertension     Past Surgical History:  Procedure Laterality Date  . LAPAROSCOPIC APPENDECTOMY N/A 10/31/2017   Procedure: APPENDECTOMY LAPAROSCOPIC;  Surgeon: Glenna Fellows, MD;  Location: [facility];  Service: General;  Laterality: N/A;    Medications: Prior to Admission medications   Medication Sig Start Date End Date Taking? Authorizing Provider  acetaminophen (TYLENOL) 500 MG tablet Take 500 mg by mouth every 6 (six) hours as needed.    [provider]  amLODipine (NORVASC) 5 MG tablet Take 5 mg by mouth daily.  10/04/17   [provider]  aspirin EC 81 MG tablet Take 81 mg by mouth daily.     [provider]  BIKTARVY 50-200-25 MG  TABS tablet Take 1 tablet by mouth daily.  09/29/17   [provider]  cholecalciferol (VITAMIN D) 1000 units tablet Take 1,000 Units by mouth daily.    [provider]  GARLIC PO Take 1 capsule by mouth daily.    [provider]  guaiFENesin (MUCINEX) 600 MG 12 hr tablet Take 600 mg by mouth daily.    [provider]  Multiple Vitamins-Minerals (MULTIVITAMIN ADULT PO) Take 1 tablet by mouth daily.    [provider]  Omega-3 Fatty Acids (FISH OIL) 1000 MG CAPS Take 1 capsule by mouth daily.    [provider]  omeprazole (PRILOSEC) 40 MG capsule Take 40 mg by mouth daily.    [provider]  ondansetron (ZOFRAN) 8 MG tablet Take 8 mg by mouth every 8 (eight) hours as needed for nausea or vomiting.  10/04/17   [provider]  Oxycodone HCl 10 MG TABS Take 1 tablet (10 mg  total) by mouth every 6 (six) hours as needed. Patient not taking: Reported on 07/12/2019 11/01/17   Mattie Marlin L, PA  polyethylene glycol [facility]) packet Take 17 g by mouth daily. Patient not taking: Reported on 07/12/2019 11/01/17   Jerre Simon, PA    Allergies:   Allergies  Allergen Reactions  . Fish Allergy Nausea And Vomiting  . Shellfish Allergy Hives and Nausea And Vomiting    Social History:  reports that he has quit smoking. His smoking use included cigarettes. He has never used smokeless tobacco. He reports current alcohol use. He reports current drug use. Frequency: 14.00 times per week. Drug: Marijuana.  Family History: Family History  Problem Relation Age of Onset  . Heart failure Father      Objective   Physical Exam: Blood pressure (!) 156/94, pulse 78, temperature 99 F (37.2 C), temperature source Oral, resp. rate 17, height 5\' 10"  (1.778 m), weight (!) 157.4 kg, SpO2 99 %.  Physical Exam Vitals and nursing note reviewed.  Constitutional:      Appearance: He is obese. He is diaphoretic.     Comments: Appears in some pain  HENT:     Head: Normocephalic.     Mouth/Throat:     Mouth: Mucous membranes are moist.  Eyes:     Conjunctiva/sclera: Conjunctivae normal.  Cardiovascular:     Rate and Rhythm: Normal rate and regular rhythm.  Pulmonary:     Effort: Pulmonary effort is normal.     Breath sounds: Normal breath sounds.  Abdominal:     General: Abdomen is flat.     Palpations: Abdomen is soft.  Musculoskeletal:     Comments:  +Right elbow pain with passive ROM +Right wrist pain with passive and active ROM +Tenderness to palpation over small joints of the right hand most notably right fifth digit +TTP over right lateral malleolus and some discomfort with flexion/extension of ankle +TTP of left ankle and lateral malleolus with some nonpitting edema, no erythema or warmth and significant discomfort with active ROM  Other joints overall  unremarkable  Skin:    Comments: Small erythematous lesions along the dorsal right and left ankle which are nontender (see picture)  Neurological:     Mental Status: He is alert. Mental status is at baseline.  Psychiatric:        Mood and Affect: Mood normal.         LABS on Admission: I have personally reviewed all the labs and imaging below    Basic Metabolic  Panel: Recent Labs  Lab 11/30/19 0436 11/30/19 0436 11/30/19 1010 12/01/19 0317  NA 136  --   --  135  K 3.6  --   --  3.9  CL 98  --   --  101  CO2 25  --   --  24  GLUCOSE 130*  --   --  106*  BUN 12  --   --  10  CREATININE 1.17   < > 1.00 1.05  CALCIUM 9.4  --   --  8.6*   < > = values in this interval not displayed.   Liver Function Tests: Recent Labs  Lab 11/30/19 1010  AST 17  ALT 20  ALKPHOS 64  BILITOT 1.3*  PROT 7.4  ALBUMIN 3.7   No results for input(s): LIPASE, AMYLASE in the last 168 hours. No results for input(s): AMMONIA in the last 168 hours. CBC: Recent Labs  Lab 11/30/19 0436 11/30/19 0436 11/30/19 1010 11/30/19 1010 12/01/19 0317  WBC 23.1*   < > 24.2*  --  19.8*  NEUTROABS 18.8*  --   --   --   --   HGB 15.2   < > 14.1  --  14.1  HCT 47.7   < > 44.5  --  44.5  MCV 82.5   < > 82.1   < > 83.6  PLT 276   < > 244  --  265   < > = values in this interval not displayed.   Cardiac Enzymes: No results for input(s): CKTOTAL, CKMB, CKMBINDEX, TROPONINI in the last 168 hours. BNP: Invalid input(s): POCBNP CBG: No results for input(s): GLUCAP in the last 168 hours.  Radiological Exams on Admission:  DG Foot Complete Left  Result Date: 11/30/2019 CLINICAL DATA:  Left foot pain. EXAM: LEFT FOOT - COMPLETE 3+ VIEW COMPARISON:  None. FINDINGS: There is no evidence of fracture or dislocation. There is no evidence of arthropathy or other focal bone abnormality. Soft tissues are unremarkable. IMPRESSION: Negative. Electronically Signed   By: Aram Candelahaddeus  Houston M.D.   On: 11/30/2019 03:02        EKG: Not done, will check   A & P   Principal Problem:   Acute left ankle pain Active Problems:   HIV (human immunodeficiency virus infection) (HCC)   Essential hypertension   Polyarthralgia   1. 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 History of HIV and STIs with high risk sexual behavior and sudden onset polyarthralgias with synovitis, nontender skin lesions and fevers b. Elevated CRP and WBC c. No sign of cellulitis or septic arthritis d. Blood cultures x2 e. Check gonococcal oral, anal, urine cytologies f. RPR g. Consulted ID, discussed with Dr. Drue SecondSnider, start on Ceftriaxone   2. Atypical chest pain, likely musculoskeletal a. Intermittent left and right chest pain b. EKG  3. Hypertension a. Continue amlodipine  4. HIV a. [facility], results in Care Everywhere b. On Biktarvy   DVT prophylaxis: lovenox   Code Status: Full Code  Diet: Heart Healthy Family Communication: Admission, patients condition and plan of care including tests being ordered have been discussed with the patient who indicates understanding and agrees with the plan and Code Status.   Disposition Plan: The appropriate patient status for this patient is OBSERVATION. Observation status is judged to be reasonable and necessary in order to provide the required intensity of service to ensure  the patient's safety. The patient's presenting symptoms, physical exam findings, and initial radiographic and laboratory data in the context of their medical condition is felt to place them at decreased risk for further clinical deterioration. Furthermore, it is anticipated that the patient will be medically stable for discharge from the hospital within 2 midnights of admission. The following factors support the patient status of observation.   " The patient's presenting  symptoms include left ankle pain, fever. " The physical exam findings include diaphoresis, rash, polyarthralgias, synovitis. " The initial radiographic and laboratory data are elevated CRP and WBC.    Consultants  . ID  Procedures  . none  Time Spent on Admission: 66 minutes    Jae Dire, DO [facility]  12/01/2019, 8:44 AM

## 2019-12-01 DIAGNOSIS — Z7982 Long term (current) use of aspirin: Secondary | ICD-10-CM | POA: Diagnosis not present

## 2019-12-01 DIAGNOSIS — A5442 Gonococcal arthritis: Secondary | ICD-10-CM | POA: Diagnosis present

## 2019-12-01 DIAGNOSIS — M159 Polyosteoarthritis, unspecified: Secondary | ICD-10-CM | POA: Diagnosis not present

## 2019-12-01 DIAGNOSIS — Z87891 Personal history of nicotine dependence: Secondary | ICD-10-CM | POA: Diagnosis not present

## 2019-12-01 DIAGNOSIS — I10 Essential (primary) hypertension: Secondary | ICD-10-CM | POA: Diagnosis present

## 2019-12-01 DIAGNOSIS — Z20822 Contact with and (suspected) exposure to covid-19: Secondary | ICD-10-CM | POA: Diagnosis present

## 2019-12-01 DIAGNOSIS — B20 Human immunodeficiency virus [HIV] disease: Secondary | ICD-10-CM | POA: Diagnosis not present

## 2019-12-01 DIAGNOSIS — A5486 Gonococcal sepsis: Secondary | ICD-10-CM | POA: Diagnosis not present

## 2019-12-01 DIAGNOSIS — Z8619 Personal history of other infectious and parasitic diseases: Secondary | ICD-10-CM | POA: Diagnosis not present

## 2019-12-01 DIAGNOSIS — M79672 Pain in left foot: Secondary | ICD-10-CM | POA: Diagnosis present

## 2019-12-01 DIAGNOSIS — M009 Pyogenic arthritis, unspecified: Secondary | ICD-10-CM | POA: Diagnosis present

## 2019-12-01 DIAGNOSIS — M2550 Pain in unspecified joint: Secondary | ICD-10-CM | POA: Diagnosis not present

## 2019-12-01 DIAGNOSIS — M25572 Pain in left ankle and joints of left foot: Secondary | ICD-10-CM | POA: Diagnosis not present

## 2019-12-01 DIAGNOSIS — Z6841 Body Mass Index (BMI) 40.0 and over, adult: Secondary | ICD-10-CM | POA: Diagnosis not present

## 2019-12-01 DIAGNOSIS — M659 Synovitis and tenosynovitis, unspecified: Secondary | ICD-10-CM | POA: Diagnosis present

## 2019-12-01 DIAGNOSIS — R21 Rash and other nonspecific skin eruption: Secondary | ICD-10-CM | POA: Diagnosis not present

## 2019-12-01 DIAGNOSIS — Z91013 Allergy to seafood: Secondary | ICD-10-CM | POA: Diagnosis not present

## 2019-12-01 DIAGNOSIS — Z8249 Family history of ischemic heart disease and other diseases of the circulatory system: Secondary | ICD-10-CM | POA: Diagnosis not present

## 2019-12-01 DIAGNOSIS — Z79899 Other long term (current) drug therapy: Secondary | ICD-10-CM | POA: Diagnosis not present

## 2019-12-01 LAB — BASIC METABOLIC PANEL
Anion gap: 10 (ref 5–15)
BUN: 10 mg/dL (ref 6–20)
CO2: 24 mmol/L (ref 22–32)
Calcium: 8.6 mg/dL — ABNORMAL LOW (ref 8.9–10.3)
Chloride: 101 mmol/L (ref 98–111)
Creatinine, Ser: 1.05 mg/dL (ref 0.61–1.24)
GFR, Estimated: >60 mL/min (ref >60)
Glucose, Bld: 106 mg/dL — ABNORMAL HIGH (ref 70–99)
Potassium: 3.9 mmol/L (ref 3.5–5.1)
Sodium: 135 mmol/L (ref 135–145)

## 2019-12-01 LAB — CBC
HCT: 44.5 % (ref 39.0–52.0)
Hemoglobin: 14.1 g/dL (ref 13.0–17.0)
MCH: 26.5 pg (ref 26.0–34.0)
MCHC: 31.7 g/dL (ref 30.0–36.0)
MCV: 83.6 fL (ref 80.0–100.0)
Platelets: 265 10*3/uL (ref 150–400)
RBC: 5.32 MIL/uL (ref 4.22–5.81)
RDW: 13.4 % (ref 11.5–15.5)
WBC: 19.8 10*3/uL — ABNORMAL HIGH (ref 4.0–10.5)
nRBC: 0 % (ref 0.0–0.2)

## 2019-12-01 LAB — GC/CHLAMYDIA PROBE AMP (~~LOC~~) NOT AT ARMC
Chlamydia: NEGATIVE
Chlamydia: NEGATIVE
Chlamydia: NEGATIVE
Comment: NEGATIVE
Comment: NEGATIVE
Comment: NORMAL
Comment: NORMAL
Comment: NEGATIVE
Comment: NORMAL
Neisseria Gonorrhea: NEGATIVE
Neisseria Gonorrhea: POSITIVE — AB
Neisseria Gonorrhea: NEGATIVE

## 2019-12-01 LAB — URIC ACID: Uric Acid, Serum: 6.3 mg/dL (ref 3.7–8.6)

## 2019-12-01 LAB — T.PALLIDUM AB, TOTAL: T Pallidum Abs: REACTIVE — AB

## 2019-12-01 LAB — T-HELPER CELLS (CD4) COUNT (NOT AT ARMC)
CD4 % Helper T Cell: 40 % (ref 33–65)
CD4 T Cell Abs: 1165 /uL (ref 400–1790)

## 2019-12-01 MED ORDER — ENOXAPARIN SODIUM 80 MG/0.8ML ~~LOC~~ SOLN
80.0000 mg | SUBCUTANEOUS | Status: DC
  Administered 2019-12-01 – 2019-12-08 (×8): 80 mg via SUBCUTANEOUS
  Filled 2019-12-01 (×8): qty 0.8

## 2019-12-01 MED ORDER — FENTANYL CITRATE (PF) 100 MCG/2ML IJ SOLN
25.0000 ug | Freq: Once | INTRAMUSCULAR | Status: AC
  Administered 2019-12-01: 25 ug via INTRAVENOUS
  Filled 2019-12-01: qty 2

## 2019-12-01 NOTE — Progress Notes (Signed)
Notified Dr. Janee Morn of patient bilateral ankle pain, 10/10 on pain scale 1-10. Sharp and constant in intensity. Call bell within reach will continue to monitor.

## 2019-12-01 NOTE — Plan of Care (Signed)
  Problem: Education: Goal: Knowledge of General Education information will improve Description: Including pain rating scale, medication(s)/side effects and non-pharmacologic comfort measures Outcome: Progressing   Problem: Health Behavior/Discharge Planning: Goal: Ability to manage health-related needs will improve Outcome: Progressing   Problem: Clinical Measurements: Goal: Ability to maintain clinical measurements within normal limits will improve Outcome: Progressing Goal: Will remain free from infection Outcome: Progressing Goal: Diagnostic test results will improve Outcome: Progressing Goal: Respiratory complications will improve Outcome: Progressing Goal: Cardiovascular complication will be avoided Outcome: Progressing   Problem: Activity: Goal: Risk for activity intolerance will decrease Outcome: Progressing   Problem: Coping: Goal: Level of anxiety will decrease Outcome: Progressing   Problem: Elimination: Goal: Will not experience complications related to bowel motility Outcome: Progressing   Problem: Pain Managment: Goal: General experience of comfort will improve Outcome: Progressing   Problem: Safety: Goal: Ability to remain free from injury will improve Outcome: Progressing   

## 2019-12-01 NOTE — Progress Notes (Signed)
[facility]    Date of Admission:  11/30/2019   Total days of antibiotics 2/ceftriaxone           ID: Jimmy Fields is a 29 y.o. male with polyarticular arthritis and rash and fever Principal Problem:   Acute left ankle pain Active Problems:   HIV (human immunodeficiency virus infection) (HCC)   Essential hypertension   Polyarthralgia   Joint pain    Subjective: Afebrile, still having ankle pain but rash is improving  Medications:  . amLODipine  10 mg Oral Daily  . aspirin EC  81 mg Oral Daily  . bictegravir-emtricitabine-tenofovir AF  1 tablet Oral Daily  . enoxaparin (LOVENOX) injection  80 mg Subcutaneous Q24H  . pantoprazole  40 mg Oral Daily    Objective: Vital signs in last 24 hours: Temp:  [98.1 F (36.7 C)-100.2 F (37.9 C)] 98.1 F (36.7 C) (11/02 1340) Pulse Rate:  [78-91] 91 (11/02 1340) Resp:  [17-20] 20 (11/02 1340) BP: (156-179)/(94-112) 174/107 (11/02 1340) SpO2:  [99 %-100 %] 100 % (11/02 1340) Weight:  [157.4 kg] 157.4 kg (11/01 2207) Physical Exam  Constitutional: He is oriented to person, place, and time. He appears well-developed and well-nourished. No distress.  HENT:  Mouth/Throat: Oropharynx is clear and moist. No oropharyngeal exudate.  Cardiovascular: Normal rate, regular rhythm and normal heart sounds. Exam reveals no gallop and no friction rub.  No murmur heard.  Pulmonary/Chest: Effort normal and breath sounds normal. No respiratory distress. He has no wheezes.  Abdominal: Soft. Bowel sounds are normal. He exhibits no distension. There is no tenderness.  Lymphadenopathy:  He has no cervical adenopathy.  Neurological: He is alert and oriented to person, place, and time.  Skin: Skin is warm and dry. No rash noted. No erythema.  GEX:[id number] of ankle and dorsum of feet L > R Psychiatric: He has a normal mood and affect. His behavior is normal.    Lab Results Recent Labs    11/30/19 0436 11/30/19 0436  11/30/19 1010 12/01/19 0317  WBC 23.1*   < > 24.2* 19.8*  HGB 15.2   < > 14.1 14.1  HCT 47.7   < > 44.5 44.5  NA 136  --   --  135  K 3.6  --   --  3.9  CL 98  --   --  101  CO2 25  --   --  24  BUN 12  --   --  10  CREATININE 1.17   < > 1.00 1.05   < > = values in this interval not displayed.   Liver Panel Recent Labs    11/30/19 1010  PROT 7.4  ALBUMIN 3.7  AST 17  ALT 20  ALKPHOS 64  BILITOT 1.3*  BILIDIR 0.2  IBILI 1.1*   Sedimentation Rate Recent Labs    11/30/19 0436  ESRSEDRATE 5   C-Reactive Protein Recent Labs    11/30/19 0620  CRP 12.0*   RPR 1:1  Microbiology: +GC from rectal swab Studies/Results: DG Foot Complete Left  Result Date: 11/30/2019 CLINICAL DATA:  Left foot pain. EXAM: LEFT FOOT - COMPLETE 3+ VIEW COMPARISON:  None. FINDINGS: There is no evidence of fracture or dislocation. There is no evidence of arthropathy or other focal bone abnormality. Soft tissues are unremarkable. IMPRESSION: Negative. Electronically Signed   By: Aram Candela M.D.   On: 11/30/2019 03:02     Assessment/Plan:  disseminated GC infection/gonococcal arthritis = continue with  ceftriaxone 2gm IV daily. Likely to start having improvement tomorrow. Plan to treat for minimum of 14 d to treat as presumed septic arthritis. Will need picc line.  [facility] for Infectious Diseases Cell: [phone] Pager: [phone]  12/01/2019, 4:23 PM

## 2019-12-01 NOTE — Progress Notes (Signed)
PROGRESS NOTE    Keishawn Rajewski  EXN:[id number] DOB: 03-06-1990 DOA: 11/30/2019 PCP: Patient, No Pcp Per    Chief Complaint  Patient presents with  . Foot Pain    Brief Narrative: HPI by Dr. Dairl Ponder This is a 29 year old male with past medical history of HIV on Biktarvy follows at [facility], syphilis s/p treatment, hypertension who presented with a left foot pain x1 day which has gradually been worsening.  Unable to bear weight.  Patient stated that he was in his usual state of health until yesterday when he was leaving the [employer] store and had sudden onset of left ankle pain and inability to bear weight.  Admitted to fevers up to 103 over the past 48 hours which have been declining and noted nausea, vomiting and a cough to the ED doc.  Admitted to having STIs in the past including syphilis and was worked up for gonorrhea in the past but not sure if he was potitive or treated.  Also admitted to some discomfort of his right elbow, wrist and right digits as well as right ankle pain.  Denied any trauma to his ankle.  No noted erythema or warmth.  States that he has been dating a new partner for the past 2 months but also had a sexual partner before that but does not believe that either of them have any symptoms.  Admits to intermittent left and right-sided chest pain.  He was unaware of any of the rashes on his feet noted during my exam.  Denies any penile discharge or urinary complaints.  He denies any known personal or family history of any rheumatologic disorders.  ED Course: Low-grade fever 100 F, HR 103, hypertensive (164/115).  Notable labs: WBC 23, CRP 12.  Left foot x-ray unremarkable.  He was given Toradol and Zofran and 1 L NS bolus.  Assessment & Plan:   Principal Problem:   DGI (disseminated gonococcal infection) (HCC) Active Problems:   Acute left ankle pain   HIV (human immunodeficiency virus infection) (HCC)   Essential hypertension   Polyarthralgia   Joint pain  1 left  ankle pain with asymmetric polyarthralgias secondary to disseminated gonococcal infection/gonococcal arthritis Patient presented with asymmetric polyarthralgias with concern for infectious etiology and disseminated gonococcal arthritis.  Patient with history of HIV, STIs with high risk sexual behavior with sudden onset polyarthralgias and synovitis, macular nontender skin lesions, fevers, elevated CRP, leukocytosis.  Leukocytosis trending down.  Blood cultures pending.  RPR 1:1.  GC chlamydia probe positive for gonorrhea and negative for chlamydia.  Patient on IV Rocephin and needs 14-day treatment per ID recommendations for presumed septic arthritis.  Per ID.  2.  Atypical chest pain, likely musculoskeletal Resolved.  EKG ordered but not done.  Currently asymptomatic.  No further cardiac work-up needed at this time.  Will reorder EKG.  3.  Hypertension Continue Norvasc.  Elevated blood pressure readings likely component of pain associated as patient noted to have blood pressures in the 130s on office visits.  Hydralazine as needed.  4.  HIV Patient follows at [facility].  Well-controlled.  Continue Biktarvy.  ID following.  5.  History of syphilis Titers of 1:1.  No further treatment needed per ID.  ID following.   DVT prophylaxis: Lovenox Code Status: Full Family Communication: Updated patient.  No family at bedside. Disposition:   Status is: Inpatient    Dispo: The patient is from: Home  Anticipated d/c is to: Home              Anticipated d/c date is: 2 to 3 days.              Patient currently on IV antibiotics, not stable for discharge.       Consultants:   ID: Dr. Drue Second 11/30/2019  Procedures:   Plain films of left foot 11/30/2019  Antimicrobials:   IV Rocephin 11/30/2019   Subjective: Patient sitting up in bed.  Complaining of right ankle pain now as well as left ankle pain.  Denies any chest pain.  No shortness of breath.  Objective: Vitals:    12/01/19 0617 12/01/19 0654 12/01/19 1049 12/01/19 1340  BP: (!) 165/109 (!) 156/94  (!) 174/107  Pulse: 84 78  91  Resp: 17   20  Temp: 99 F (37.2 C)  98.7 F (37.1 C) 98.1 F (36.7 C)  TempSrc: Oral  Oral Oral  SpO2: 99%   100%  Weight:      Height:        Intake/Output Summary (Last 24 hours) at 12/01/2019 1731 Last data filed at 12/01/2019 1627 Gross per 24 hour  Intake 1980.95 ml  Output 2250 ml  Net -269.05 ml   Filed Weights   11/30/19 2207  Weight: (!) 157.4 kg    Examination:  General exam: Appears calm and comfortable  Respiratory system: Clear to auscultation. Respiratory effort normal. Cardiovascular system: S1 & S2 heard, RRR. No JVD, murmurs, rubs, gallops or clicks. No pedal edema. Gastrointestinal system: Abdomen is nondistended, soft and nontender. No organomegaly or masses felt. Normal bowel sounds heard. Central nervous system: Alert and oriented. No focal neurological deficits. Extremities: Symmetric 5 x 5 power. Skin: Small erythematous macular lesions along the dorsal right and left ankle, nontender.  Psychiatry: Judgement and insight appear normal. Mood & affect appropriate.     Data Reviewed: I have personally reviewed following labs and imaging studies  CBC: Recent Labs  Lab 11/30/19 0436 11/30/19 1010 12/01/19 0317  WBC 23.1* 24.2* 19.8*  NEUTROABS 18.8*  --   --   HGB 15.2 14.1 14.1  HCT 47.7 44.5 44.5  MCV 82.5 82.1 83.6  PLT 276 244 265    Basic Metabolic Panel: Recent Labs  Lab 11/30/19 0436 11/30/19 1010 12/01/19 0317  NA 136  --  135  K 3.6  --  3.9  CL 98  --  101  CO2 25  --  24  GLUCOSE 130*  --  106*  BUN 12  --  10  CREATININE 1.17 1.00 1.05  CALCIUM 9.4  --  8.6*    GFR: Estimated Creatinine Clearance: 156.8 mL/min (by C-G formula based on SCr of 1.05 mg/dL).  Liver Function Tests: Recent Labs  Lab 11/30/19 1010  AST 17  ALT 20  ALKPHOS 64  BILITOT 1.3*  PROT 7.4  ALBUMIN 3.7    CBG: No results  for input(s): GLUCAP in the last 168 hours.   Recent Results (from the past 240 hour(s))  Respiratory Panel by RT PCR (Flu A&B, Covid) -     Status: None   Collection Time: 11/30/19  4:36 AM   Specimen: Nasopharyngeal  Result Value Ref Range Status   SARS Coronavirus 2 by RT PCR NEGATIVE NEGATIVE Final    Comment: (NOTE) SARS-CoV-2 target nucleic acids are NOT DETECTED.  The SARS-CoV-2 RNA is generally detectable in upper respiratoy specimens during the acute phase of infection. The lowest  concentration of SARS-CoV-2 viral copies this assay can detect is 131 copies/mL. A negative result does not preclude SARS-Cov-2 infection and should not be used as the sole basis for treatment or other patient management decisions. A negative result may occur with  improper specimen collection/handling, submission of specimen other than nasopharyngeal swab, presence of viral mutation(s) within the areas targeted by this assay, and inadequate number of viral copies (<131 copies/mL). A negative result must be combined with clinical observations, patient history, and epidemiological information. The expected result is Negative.  Fact Sheet for Patients:  https://www.moore.com/  Fact Sheet for Healthcare Providers:  https://www.young.biz/  This test is no t yet approved or cleared by the [place] FDA and  has been authorized for detection and/or diagnosis of SARS-CoV-2 by FDA under an Emergency Use Authorization (EUA). This EUA will remain  in effect (meaning this test can be used) for the duration of the COVID-19 declaration under Section 564(b)(1) of the Act, 21 U.S.C. section 360bbb-3(b)(1), unless the authorization is terminated or revoked sooner.     Influenza A by PCR NEGATIVE NEGATIVE Final   Influenza B by PCR NEGATIVE NEGATIVE Final    Comment: (NOTE) The Xpert Xpress SARS-CoV-2/FLU/RSV assay is intended as an aid in  the diagnosis of  influenza from Nasopharyngeal swab specimens and  should not be used as a sole basis for treatment. Nasal washings and  aspirates are unacceptable for Xpert Xpress SARS-CoV-2/FLU/RSV  testing.  Fact Sheet for Patients: https://www.moore.com/  Fact Sheet for Healthcare Providers: https://www.young.biz/  This test is not yet approved or cleared by the [place] FDA and  has been authorized for detection and/or diagnosis of SARS-CoV-2 by  FDA under an Emergency Use Authorization (EUA). This EUA will remain  in effect (meaning this test can be used) for the duration of the  Covid-19 declaration under Section 564(b)(1) of the Act, 21  U.S.C. section 360bbb-3(b)(1), unless the authorization is  terminated or revoked. Performed at [facility], [address], [place], [place] [address]   Culture, blood (routine x 2)     Status: None (Preliminary result)   Collection Time: 11/30/19  9:40 AM   Specimen: BLOOD LEFT HAND  Result Value Ref Range Status   Specimen Description   Final    BLOOD LEFT HAND Performed at [facility], [address], [place], [place] [address]    Special Requests   Final    BOTTLES DRAWN AEROBIC ONLY Blood Culture adequate volume Performed at [facility], [address] [place], [place] [address]    Culture   Final    NO GROWTH < 24 HOURS Performed at [facility], [address] [place], [place] [address]    Report Status PENDING  Incomplete  Culture, blood (routine x 2)     Status: None (Preliminary result)   Collection Time: 11/30/19 10:12 AM   Specimen: BLOOD RIGHT HAND  Result Value Ref Range Status   Specimen Description   Final    BLOOD RIGHT HAND Performed at [facility], [address] [place], [place] [address]    Special Requests   Final    BOTTLES DRAWN AEROBIC AND ANAEROBIC Blood Culture adequate volume Performed  at [facility], [address], [place], [place] [address]    Culture   Final    NO GROWTH < 24 HOURS Performed at [facility], [address], [place], [place] [address]  Report Status PENDING  Incomplete         Radiology Studies: DG Foot Complete Left  Result Date: 11/30/2019 CLINICAL DATA:  Left foot pain. EXAM: LEFT FOOT - COMPLETE 3+ VIEW COMPARISON:  None. FINDINGS: There is no evidence of fracture or dislocation. There is no evidence of arthropathy or other focal bone abnormality. Soft tissues are unremarkable. IMPRESSION: Negative. Electronically Signed   By: Aram Candelahaddeus  Houston M.D.   On: 11/30/2019 03:02        Scheduled Meds: . amLODipine  10 mg Oral Daily  . aspirin EC  81 mg Oral Daily  . bictegravir-emtricitabine-tenofovir AF  1 tablet Oral Daily  . enoxaparin (LOVENOX) injection  80 mg Subcutaneous Q24H  . pantoprazole  40 mg Oral Daily   Continuous Infusions: . sodium chloride Stopped (12/01/19 0100)  . cefTRIAXone (ROCEPHIN)  IV 2 g (12/01/19 1053)     LOS: 0 days    Time spent: 35 minutes    Ramiro Harvestaniel Lirio Bach, MD [facility]   To contact the attending provider between 7A-7P or the covering provider during after hours 7P-7A, please log into the web site [email or web address] and access using universal [employer] password for that web site. If you do not have the password, please call the hospital operator.  12/01/2019, 5:31 PM

## 2019-12-01 NOTE — Plan of Care (Signed)
Plan of care reviewed and discussed with the patient. 

## 2019-12-02 ENCOUNTER — Inpatient Hospital Stay: Payer: Self-pay

## 2019-12-02 LAB — CBC WITH DIFFERENTIAL/PLATELET
Abs Immature Granulocytes: 0.06 10*3/uL (ref 0.00–0.07)
Basophils Absolute: 0.1 10*3/uL (ref 0.0–0.1)
Basophils Relative: 0 %
Eosinophils Absolute: 0.3 10*3/uL (ref 0.0–0.5)
Eosinophils Relative: 2 %
HCT: 44.9 % (ref 39.0–52.0)
Hemoglobin: 14.1 g/dL (ref 13.0–17.0)
Immature Granulocytes: 0 %
Lymphocytes Relative: 19 %
Lymphs Abs: 2.9 10*3/uL (ref 0.7–4.0)
MCH: 26.4 pg (ref 26.0–34.0)
MCHC: 31.4 g/dL (ref 30.0–36.0)
MCV: 83.9 fL (ref 80.0–100.0)
Monocytes Absolute: 1.9 10*3/uL — ABNORMAL HIGH (ref 0.1–1.0)
Monocytes Relative: 12 %
Neutro Abs: 10.6 10*3/uL — ABNORMAL HIGH (ref 1.7–7.7)
Neutrophils Relative %: 67 %
Platelets: 264 10*3/uL (ref 150–400)
RBC: 5.35 MIL/uL (ref 4.22–5.81)
RDW: 13.2 % (ref 11.5–15.5)
WBC: 15.8 10*3/uL — ABNORMAL HIGH (ref 4.0–10.5)
nRBC: 0 % (ref 0.0–0.2)

## 2019-12-02 LAB — BASIC METABOLIC PANEL
Anion gap: 12 (ref 5–15)
BUN: 11 mg/dL (ref 6–20)
CO2: 26 mmol/L (ref 22–32)
Calcium: 9 mg/dL (ref 8.9–10.3)
Chloride: 97 mmol/L — ABNORMAL LOW (ref 98–111)
Creatinine, Ser: 1.03 mg/dL (ref 0.61–1.24)
GFR, Estimated: >60 mL/min (ref >60)
Glucose, Bld: 124 mg/dL — ABNORMAL HIGH (ref 70–99)
Potassium: 3.9 mmol/L (ref 3.5–5.1)
Sodium: 135 mmol/L (ref 135–145)

## 2019-12-02 LAB — HIV-1 RNA QUANT-NO REFLEX-BLD
HIV 1 RNA Quant: <20 copies/mL
LOG10 HIV-1 RNA: UNDETERMINED log10copy/mL

## 2019-12-02 LAB — MAGNESIUM: Magnesium: 2.2 mg/dL (ref 1.7–2.4)

## 2019-12-02 MED ORDER — METOPROLOL TARTRATE 5 MG/5ML IV SOLN
5.0000 mg | Freq: Once | INTRAVENOUS | Status: AC
  Administered 2019-12-02: 5 mg via INTRAVENOUS
  Filled 2019-12-02: qty 5

## 2019-12-02 MED ORDER — METOPROLOL TARTRATE 12.5 MG HALF TABLET
12.5000 mg | ORAL_TABLET | Freq: Two times a day (BID) | ORAL | Status: DC
  Administered 2019-12-02 – 2019-12-08 (×12): 12.5 mg via ORAL
  Filled 2019-12-02 (×12): qty 1

## 2019-12-02 NOTE — Plan of Care (Signed)
Plan of care reviewed and discussed with the patient. 

## 2019-12-02 NOTE — Progress Notes (Addendum)
Telephone order for patient to receive lopressor 5 mg iV once now. Also to start lopressor 12.5 mg po BID. Educated patient. Call bell within reach. Will continue to monitor closely. Telephone order from Dr. Ashley Royalty, read back information to MD to confirm.

## 2019-12-02 NOTE — Progress Notes (Signed)
PAGED DR MATTHEWS THAT PATIENT IS VERY HYPERTENSIVE 180/120. AWAITING ORDERS

## 2019-12-02 NOTE — Progress Notes (Signed)
    [facility]    Date of Admission:  11/30/2019   Total days of antibiotics 3/ceftriaxone           ID: Jimmy Fields is a 29 y.o. male with disseminated gonococcal infection Principal Problem:   DGI (disseminated gonococcal infection) (HCC) Active Problems:   HIV (human immunodeficiency virus infection) (HCC)   Essential hypertension   Polyarthralgia   Acute left ankle pain   Joint pain    Subjective: Afebrile but still having significant ankle pain, unable to be weight bearing  Medications:  . amLODipine  10 mg Oral Daily  . aspirin EC  81 mg Oral Daily  . bictegravir-emtricitabine-tenofovir AF  1 tablet Oral Daily  . enoxaparin (LOVENOX) injection  80 mg Subcutaneous Q24H  . pantoprazole  40 mg Oral Daily    Objective: Vital signs in last 24 hours: Temp:  [97.8 F (36.6 C)-98.3 F (36.8 C)] 97.8 F (36.6 C) (11/03 1404) Pulse Rate:  [73-80] 80 (11/03 1404) Resp:  [17] 17 (11/03 1404) BP: (148-172)/(96-109) 164/109 (11/03 1404) SpO2:  [98 %-100 %] 100 % (11/03 1404)  Physical Exam  Constitutional: He is oriented to person, place, and time. He appears well-developed and well-nourished. No distress.  HENT:  Mouth/Throat: Oropharynx is clear and moist. No oropharyngeal exudate.  Cardiovascular: Normal rate, regular rhythm and normal heart sounds. Exam reveals no gallop and no friction rub.  No murmur heard.  Pulmonary/Chest: Effort normal and breath sounds normal. No respiratory distress. He has no wheezes.  Neurological: He is alert and oriented to person, place, and time.  Skin: Skin is warm and dry. No rash noted. No erythema.  Ext: left swelling of left ankle Psychiatric: He has a normal mood and affect. His behavior is normal.    Lab Results Recent Labs    12/01/19 0317 12/02/19 0349  WBC 19.8* 15.8*  HGB 14.1 14.1  HCT 44.5 44.9  NA 135 135  K 3.9 3.9  CL 101 97*  CO2 24 26  BUN 10 11  CREATININE 1.05 1.03   Liver  Panel Recent Labs    11/30/19 1010  PROT 7.4  ALBUMIN 3.7  AST 17  ALT 20  ALKPHOS 64  BILITOT 1.3*  BILIDIR 0.2  IBILI 1.1*   Sedimentation Rate Recent Labs    11/30/19 0436  ESRSEDRATE 5   C-Reactive Protein Recent Labs    11/30/19 0620  CRP 12.0*    Microbiology: +GC on rectal swab Studies/Results: [place] EKG SITE RITE  Result Date: 12/02/2019 If Site Rite image not attached, placement could not be confirmed due to current cardiac rhythm.    Assessment/Plan: DGI = recommend 2 wk of ceftriaxone 2gm IV daily. Will need picc line  Left ankle pain = recommend to get MRI to see if need to get aspiration/wash out since having still difficulty with weight bearing  hiv disease = continue on biktarvy  Will need PT to evaluate, patient lives on 2nd floor, concern how he will get home.  [facility] for Infectious Diseases Cell: [phone] Pager: [phone]  12/02/2019, 4:29 PM

## 2019-12-02 NOTE — TOC Initial Note (Signed)
Transition of Care [facility]) - Initial/Assessment Note    Patient Details  Name: Jimmy Fields MRN: [id number] Date of Birth: [birth year]  [facility]) CM/SW Contact:    Lia Hopping, [place] Phone Number: 12/02/2019, 4:17 PM  Clinical Narrative:    Patient admitted for left foot pain which has been worsening. Patient unable to bear weight. I&D recommendation for PICC placement/IV antibiotics.  Re: Home Health/IV antibiotics.              CSW met with the patient at bedside to discuss his discharge plan home with IV antibiotics. Patient agreeable. CSW made a referral to Carolynn Sayers w/ [facility] IV infusion team. CSW made a referral to [facility] for RN services.   Patient reports he independent with his ADL's however he has not been able to bear weight since the swelling in his ankle. Patient express concerns that he has not walked since being admitted. Physician notified. PT ordered.   TOC staff will continue to follow this patient.    Expected Discharge Plan: [place] Barriers to Discharge: Continued Medical Work up   Patient Goals and CMS Choice Patient states their goals for this hospitalization and ongoing recovery are:: get better      Expected Discharge Plan and Services Expected Discharge Plan: [place] In-house Referral: Clinical Social Work Discharge Planning Services: CM Consult Post Acute Care Choice: [place] arrangements for the past 2 months: Apartment                           HH Arranged: RN, IV Antibiotics HH Agency: [facility], [facility] Date [place]: 12/02/19 Time [place]: 6015    Prior Living Arrangements/Services Living arrangements for the past 2 months: Apartment Lives with:: Roommate Patient language and need for interpreter reviewed:: No Do you feel safe going back to the place where you live?: Yes      Need for Family Participation in  Patient Care: Yes (Comment) Care giver support system in place?: Yes (comment)   Criminal Activity/Legal Involvement Pertinent to Current Situation/Hospitalization: No - Comment as needed  Activities of Daily Living Home Assistive Devices/Equipment: Eyeglasses ADL Screening (condition at time of admission) Patient's cognitive ability adequate to safely complete daily activities?: Yes Is the patient deaf or have difficulty hearing?: No Does the patient have difficulty seeing, even when wearing glasses/contacts?: No Does the patient have difficulty concentrating, remembering, or making decisions?: No Patient able to express need for assistance with ADLs?: Yes Does the patient have difficulty dressing or bathing?: No Independently performs ADLs?: Yes (appropriate for developmental age) Does the patient have difficulty walking or climbing stairs?: No Weakness of Legs: None Weakness of Arms/Hands: None  Permission Sought/Granted Permission sought to share information with : Other (comment), Case Manager [facility]) Permission granted to share information with : Yes, Verbal Permission Granted     Permission granted to share info w AGENCY: Home Health Agencies        Emotional Assessment Appearance:: Appears stated age Attitude/Demeanor/Rapport: Engaged Affect (typically observed): Accepting, Pleasant Orientation: : Oriented to Self, Oriented to Place, Oriented to  Time, Oriented to Situation Alcohol / Substance Use: Not Applicable Psych Involvement: No (comment)  Admission diagnosis:  Joint pain [I15.37] Neutrophilic leukocytosis [H43.2] Left foot pain [M79.672] SIRS (systemic inflammatory response syndrome) (HCC) [R65.10] Nausea and vomiting in adult [R11.2] Patient Active Problem List  Diagnosis Date Noted  . Joint pain 12/01/2019  . DGI (disseminated gonococcal infection) ([place])   . Polyarthralgia 11/30/2019  . Acute left ankle pain 11/30/2019  . HIV (human  immunodeficiency virus infection) ([place])   . Essential hypertension   . Acute appendicitis s/p lap appendectomy 10/31/2017 10/25/2017   PCP:  Patient, No Pcp Per Pharmacy:   [facility] [address], [place] [address] Phone: [phone] Fax: [phone]     Social Determinants of Health (SDOH) Interventions    Readmission Risk Interventions No flowsheet data found.

## 2019-12-02 NOTE — Progress Notes (Signed)
PROGRESS NOTE    Jimmy Fields  VPX:[id number] DOB: 05-09-1990 DOA: 11/30/2019 PCP: Patient, No Pcp Per   Brief Narrative: HPI by Dr. Dairl Ponder This is a 29 year old male with past medical history of [facility] s/p [facility],hypertension who presented with a left foot pain x1 day which has gradually been worsening. Unable to bear weight.  Patient stated that he was in his usual state of health until yesterday when he was leaving the [employer] and had sudden onset of left ankle pain and inability to bear weight. Admitted to fevers up to 103 over the past 48 hours which have been declining and noted nausea, vomiting and a cough to the ED doc. Admitted to having STIs in the past including syphilis and was worked up for gonorrhea in the past but not sure if he was potitive or treated. Also admitted to some discomfort of his right elbow, wrist and right digits as well as right ankle pain. Denied any trauma to his ankle. No noted erythema or warmth. States that he has been dating a new partner for the past 2 months but also had a sexual partner before that but does not believe that either of them have any symptoms. Admits to intermittent left and right-sided chest pain. He was unaware of any of the rashes on his feet noted during my exam. Denies any penile discharge or urinary complaints. He denies any known personal or family history of any rheumatologic disorders.  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  Seen by ID patient on ceftriaxone 2 g IV daily for 14 days.  PICC line ordered.  #2 history of essential  hypertension continue Norvasc.  #3 history of HIV/syphilis follows at [facility].  On [place].    Estimated body mass index is 49.79 kg/m as calculated from the following:   Height as of this encounter: 5\' 10"  (1.778 m).   Weight as of this encounter: 157.4 kg.  DVT prophylaxis: Lovenox  code Status: Full code Family Communication: None at bedside  disposition Plan:  Status is: Inpatient   Dispo: The patient is from: Home              Anticipated d/c is to: Home              Anticipated d/c date is: 2 days              Patient currently is not medically stable to d/c.    Consultants: Infectious disease  Procedures: None Antimicrobials Rocephin  Subjective: Continues to complain of left ankle pain asking for steroid shot in the ankle  Objective: Vitals:   12/01/19 1049 12/01/19 1340 12/01/19 2150 12/02/19 0609  BP:  (!) 174/107 (!) 148/96 (!) 172/97  Pulse:  91 78 73  Resp:  20 17 17   Temp: 98.7 F (37.1 C) 98.1 F (36.7 C) 98.3 F (36.8 C) 98.1 F (36.7 C)  TempSrc: Oral Oral Oral Oral  SpO2:  100% 98% 99%  Weight:      Height:        Intake/Output Summary (Last 24 hours) at 12/02/2019 1204 Last data filed at 12/02/2019 [phone]  Gross per 24 hour  Intake 1341.19 ml  Output 250 ml  Net 1091.19 ml   Filed Weights   11/30/19 2207  Weight: (!) 157.4 kg    Examination:  General exam: Appears calm and comfortable  Respiratory system: Clear to auscultation. Respiratory effort normal. Cardiovascular system: S1 & S2 heard, RRR. No JVD, murmurs, rubs, gallops or clicks. No pedal edema. Gastrointestinal system: Abdomen is nondistended, soft and nontender. No organomegaly or masses felt. Normal bowel sounds heard. Central nervous system: Alert and oriented. No focal neurological deficits. Extremities: Swollen left ankle Skin: Multiple erythematous lesions in the lower extremities noted Psychiatry: Judgement and insight appear normal. Mood & affect appropriate.      Data Reviewed: I have personally reviewed following labs and imaging studies  CBC: Recent Labs  Lab 11/30/19 0436 11/30/19 1010 12/01/19 0317 12/02/19 0349  WBC 23.1* 24.2* 19.8* 15.8*  NEUTROABS 18.8*  --   --  10.6*  HGB 15.2 14.1 14.1 14.1  HCT 47.7 44.5 44.5 44.9  MCV 82.5 82.1 83.6 83.9  PLT 276 244 265 264   Basic Metabolic Panel: Recent Labs  Lab 11/30/19 0436 11/30/19 1010 12/01/19 0317 12/02/19 0349  NA 136  --  135 135  K 3.6  --  3.9 3.9  CL 98  --  101 97*  CO2 25  --  24 26  GLUCOSE 130*  --  106* 124*  BUN 12  --  10 11  CREATININE 1.17 1.00 1.05 1.03  CALCIUM 9.4  --  8.6* 9.0  MG  --   --   --  2.2   GFR: Estimated Creatinine Clearance: 159.9 mL/min (by C-G formula based on SCr of 1.03 mg/dL). Liver Function Tests: Recent Labs  Lab 11/30/19 1010  AST 17  ALT 20  ALKPHOS 64  BILITOT 1.3*  PROT 7.4  ALBUMIN 3.7   No results for input(s): LIPASE, AMYLASE in the last 168 hours. No results for input(s): AMMONIA in the last 168 hours. Coagulation Profile: No results for input(s): INR, PROTIME in the last 168 hours. Cardiac Enzymes: No results for input(s): CKTOTAL, CKMB, CKMBINDEX, TROPONINI in the last 168 hours. BNP (last 3 results) No results for input(s): PROBNP in the last 8760 hours. HbA1C: No results for input(s): HGBA1C in the last 72 hours. CBG: No results for input(s): GLUCAP in the last 168 hours. Lipid Profile: No results for input(s): CHOL, HDL, LDLCALC, TRIG, CHOLHDL, LDLDIRECT in the last 72 hours. Thyroid Function Tests: No results for input(s): TSH, T4TOTAL, FREET4, T3FREE, THYROIDAB in the last 72 hours. Anemia Panel: No results for input(s): VITAMINB12, FOLATE, FERRITIN, TIBC, IRON, RETICCTPCT in the last 72 hours. Sepsis Labs: Recent Labs  Lab 11/30/19 [id number]  LATICACIDVEN 0.9    Recent Results (from the past 240 hour(s))  Respiratory Panel by RT PCR (Flu A&B, Covid) -     Status: None   Collection Time:  11/30/19  4:36 AM   Specimen: Nasopharyngeal  Result Value Ref Range Status   SARS Coronavirus 2 by RT PCR NEGATIVE NEGATIVE Final    Comment: (NOTE) SARS-CoV-2 target nucleic acids are NOT DETECTED.  The SARS-CoV-2 RNA is generally detectable in upper respiratoy specimens during the acute phase of infection. The lowest concentration of SARS-CoV-2 viral copies this assay can detect is 131 copies/mL. A negative result does not preclude SARS-Cov-2 infection and should not be used as the sole basis for treatment or other patient management decisions. A negative result may occur with  improper specimen collection/handling, submission of specimen other than nasopharyngeal swab, presence of viral mutation(s) within the areas targeted by this assay, and inadequate number of viral copies (<131 copies/mL). A negative result must be combined with clinical observations, patient history, and epidemiological information. The expected result is Negative.  Fact Sheet for Patients:  https://www.moore.com/  Fact Sheet for Healthcare Providers:  https://www.young.biz/  This test is no t yet approved or cleared by the [place] FDA and  has been authorized for detection and/or diagnosis of SARS-CoV-2 by FDA under an Emergency Use Authorization (EUA). This EUA will remain  in effect (meaning this test can be used) for the duration of the COVID-19 declaration under Section 564(b)(1) of the Act, 21 U.S.C. section 360bbb-3(b)(1), unless the authorization is terminated or revoked sooner.     Influenza A by PCR NEGATIVE NEGATIVE Final   Influenza B by PCR NEGATIVE NEGATIVE Final    Comment: (NOTE) The Xpert Xpress SARS-CoV-2/FLU/RSV assay is intended as an aid in  the diagnosis of influenza from Nasopharyngeal swab specimens and  should not be used as a sole basis for treatment. Nasal washings and  aspirates are unacceptable for Xpert Xpress  SARS-CoV-2/FLU/RSV  testing.  Fact Sheet for Patients: https://www.moore.com/  Fact Sheet for Healthcare Providers: https://www.young.biz/  This test is not yet approved or cleared by the [place] FDA and  has been authorized for detection and/or diagnosis of SARS-CoV-2 by  FDA under an Emergency Use Authorization (EUA). This EUA will remain  in effect (meaning this test can be used) for the duration of the  Covid-19 declaration under Section 564(b)(1) of the Act, 21  U.S.C. section 360bbb-3(b)(1), unless the authorization is  terminated or revoked. Performed at [facility], [address] [place], [place] [address]   Culture, blood (routine x 2)     Status: None (Preliminary result)   Collection Time: 11/30/19  9:40 AM   Specimen: BLOOD LEFT HAND  Result Value Ref Range Status   Specimen Description   Final    BLOOD LEFT HAND Performed at [facility], [address], [place], [place] [address]    Special Requests   Final    BOTTLES DRAWN AEROBIC ONLY Blood Culture adequate volume Performed at [facility], [address], [place], [place] [address]    Culture   Final    NO GROWTH 2 DAYS Performed at [facility], [address], [place], [place] [address]    Report Status PENDING  Incomplete  Culture, blood (routine x 2)     Status: None (Preliminary result)   Collection Time: 11/30/19 10:12 AM   Specimen: BLOOD RIGHT HAND  Result Value Ref Range Status   Specimen Description   Final    BLOOD RIGHT HAND Performed at [facility], [address], [place], [place] [address]    Special Requests   Final    BOTTLES DRAWN AEROBIC AND ANAEROBIC Blood Culture adequate volume Performed at [facility], [address], [place], [place] [address]    Culture   Final    NO GROWTH 2 DAYS Performed at [facility], [address], [place], [place] [address]    Report Status PENDING  Incomplete         Radiology Studies: [place] EKG SITE RITE  Result Date: 12/02/2019 If Site Rite image not attached, placement could not be confirmed due to current cardiac rhythm.  Scheduled Meds: . amLODipine  10 mg Oral Daily  . aspirin EC  81 mg Oral Daily  . bictegravir-emtricitabine-tenofovir AF  1 tablet Oral Daily  . enoxaparin (LOVENOX) injection  80 mg Subcutaneous Q24H  . pantoprazole  40 mg Oral Daily   Continuous Infusions: . sodium chloride Stopped (12/01/19 0100)  . cefTRIAXone (ROCEPHIN)  IV 2 g (12/02/19 0924)     LOS: 1 day   Alwyn Ren, MD  12/02/2019, 12:04 PM

## 2019-12-03 ENCOUNTER — Other Ambulatory Visit: Payer: Self-pay

## 2019-12-03 ENCOUNTER — Inpatient Hospital Stay (HOSPITAL_COMMUNITY): Payer: PRIVATE HEALTH INSURANCE

## 2019-12-03 ENCOUNTER — Inpatient Hospital Stay: Payer: Self-pay

## 2019-12-03 MED ORDER — GADOBUTROL 1 MMOL/ML IV SOLN
10.0000 mL | Freq: Once | INTRAVENOUS | Status: AC | PRN
  Administered 2019-12-03: 10 mL via INTRAVENOUS

## 2019-12-03 MED ORDER — CHLORHEXIDINE GLUCONATE CLOTH 2 % EX PADS
6.0000 | MEDICATED_PAD | Freq: Every day | CUTANEOUS | Status: DC
  Administered 2019-12-03 – 2019-12-08 (×5): 6 via TOPICAL

## 2019-12-03 MED ORDER — SODIUM CHLORIDE 0.9% FLUSH: 10.0000 mL | INTRAVENOUS | Status: DC | PRN

## 2019-12-03 MED ORDER — DM-GUAIFENESIN ER 30-600 MG PO TB12
1.0000 | ORAL_TABLET | Freq: Two times a day (BID) | ORAL | Status: DC
  Administered 2019-12-03 – 2019-12-08 (×10): 1 via ORAL
  Filled 2019-12-03 (×10): qty 1

## 2019-12-03 MED ORDER — SODIUM CHLORIDE 0.9% FLUSH
10.0000 mL | Freq: Two times a day (BID) | INTRAVENOUS | Status: DC
  Administered 2019-12-03 – 2019-12-08 (×6): 10 mL

## 2019-12-03 NOTE — Evaluation (Signed)
Physical Therapy Evaluation Patient Details Name: Jimmy Fields MRN: [id number] DOB: [birth year] Today's Date: 12/03/2019   History of Present Illness  Patient is a 29 year old male with past medical history of HIV on Biktarvy follows at [facility], syphilis s/p treatment, hypertension who presented with a left foot pain x1 day which has gradually been worsening.  Unable to bear weight.   Clinical Impression  Jimmy Fields is 29 y.o. male admitted with above HPI and diagnosis. Patient is currently limited by functional impairments below (see PT problem list). Patient lives with a roommate and is independent at baseline. He currently requires min assist for transfers and gait with RW. Patient will benefit from continued skilled PT interventions to address impairments and progress independence with mobility. Anticipate as pt's pain is controlled he will progress with therapy and not require follow up. At this time he is unsafe to return home as he has a full flight of stairs to enter his 2nd floor apartment and is unsafe to complete stairs at this time. Acute PT will follow and progress as able.       12/03/19 1200  PT Visit Information  Last PT Received On 12/03/19  Assistance Needed +1  History of Present Illness Patient is a 29 year old male with past medical history of HIV on Biktarvy follows at [facility], syphilis s/p treatment, hypertension who presented with a left foot pain x1 day which has gradually been worsening.  Unable to bear weight.  Precautions  Precautions Fall  Restrictions  Weight Bearing Restrictions No  Home Living  Family/patient expects to be discharged to: Private residence  Living Arrangements Non-relatives/Friends  Available Help at Discharge Friend(s)  Type of Home Apartment  Home Access Stairs to enter  Entrance Stairs-Number of Steps 20  Entrance Stairs-Rails Right;Left  Home Layout One level  Bathroom Armed forces technical officer No  Home Equipment None  Prior Function  Level of Independence Independent  Communication  Communication No difficulties  Pain Assessment  Pain Assessment Faces  Faces Pain Scale 6  Pain Location Lt foot  Pain Descriptors / Indicators Aching;Discomfort;Guarding  Pain Intervention(s) Limited activity within patient's tolerance;Monitored during session;Repositioned;Ice applied;Premedicated before session  Cognition  Arousal/Alertness Awake/alert  Behavior During Therapy WFL for tasks assessed/performed  Overall Cognitive Status Within Functional Limits for tasks assessed  Upper Extremity Assessment  Upper Extremity Assessment Overall WFL for tasks assessed  Lower Extremity Assessment  Lower Extremity Assessment Overall WFL for tasks assessed  Cervical / Trunk Assessment  Cervical / Trunk Assessment Normal  Bed Mobility  Overal bed mobility Needs Assistance  Bed Mobility Supine to Sit  Supine to sit Supervision;HOB elevated  General bed mobility comments no assist required to sit up EOB.  Transfers  Overall transfer level Needs assistance  Equipment used Rolling walker (2 wheeled)  Transfers Sit to/from Stand  Sit to Stand Min assist;From elevated surface  General transfer comment cues for safe hand placement on RW, assist required for power up.  Ambulation/Gait  Ambulation/Gait assistance Min assist;+2 safety/equipment  Gait Distance (Feet) 15 Feet  Assistive device Rolling walker (2 wheeled)  Gait Pattern/deviations Decreased step length - right;Decreased step length - left;Decreased stride length;Step-through pattern;Antalgic  General Gait Details pt walking on lateral aspect of Rt foot due to pain and heavily reliant on UE use to reduce pressure on Lt LE with stance phase.   Gait velocity decr  Balance  Overall balance assessment Needs assistance  Sitting-balance support Feet supported  Sitting balance-Leahy Scale Good  Standing balance support  During functional activity;Bilateral upper extremity supported  Standing balance-Leahy Scale Poor  PT - End of Session  Equipment Utilized During Treatment Gait belt  Activity Tolerance Patient tolerated treatment well  Patient left in chair;with call bell/phone within reach  Nurse Communication Mobility status  PT Assessment  PT Recommendation/Assessment Patient needs continued PT services  PT Visit Diagnosis Difficulty in walking, not elsewhere classified (R26.2);Pain;Other abnormalities of gait and mobility (R26.89)  Pain - Right/Left  (bil)  Pain - part of body Ankle and joints of foot  PT Problem List Decreased activity tolerance;Decreased balance;Decreased mobility;Decreased knowledge of use of DME;Decreased knowledge of precautions;Pain;Obesity  PT Plan  PT Frequency (ACUTE ONLY) Min 4X/week  PT Treatment/Interventions (ACUTE ONLY) DME instruction;Gait training;Stair training;Functional mobility training;Therapeutic activities;Therapeutic exercise;Balance training;Patient/family education  AM-PAC PT "6 Clicks" Mobility Outcome Measure (Version 2)  Help needed turning from your back to your side while in a flat bed without using bedrails? 4  Help needed moving from lying on your back to sitting on the side of a flat bed without using bedrails? 4  Help needed moving to and from a bed to a chair (including a wheelchair)? 3  Help needed standing up from a chair using your arms (e.g., wheelchair or bedside chair)? 3  Help needed to walk in hospital room? 3  Help needed climbing 3-5 steps with a railing?  2  6 Click Score 19  Consider Recommendation of Discharge To: Home with [facility]  PT Recommendation  Follow Up Recommendations No PT follow up (anticipate no PT follow up needed after pt pain controlled)  PT equipment Rolling walker with 5" wheels;3in1 (PT) (shower seat)  Individuals Consulted  Consulted and Agree with Results and Recommendations Patient  Acute Rehab PT Goals  Patient  Stated Goal return home and be able to walk indepenently again  PT Goal Formulation With patient  Time For Goal Achievement 12/17/19  Potential to Achieve Goals Good  PT Time Calculation  PT Start Time (ACUTE ONLY) 1214  PT Stop Time (ACUTE ONLY) 1237  PT Time Calculation (min) (ACUTE ONLY) 23 min  PT General Charges  $$ ACUTE PT VISIT 1 Visit  PT Evaluation  $PT Eval Low Complexity 1 Low  PT Treatments  $Gait Training 8-22 mins  Written Expression  Dominant Hand Right    Wynn Maudlin, DPT Acute Rehabilitation Services  Office [phone] Pager [phone]  12/03/2019 4:30 PM

## 2019-12-03 NOTE — Progress Notes (Signed)
[name], MD paged regarding the pt's c/o of heart palpitations. Pt denies chest pain.

## 2019-12-03 NOTE — Progress Notes (Signed)
PROGRESS NOTE    Jimmy Fields  GLO:[id number] DOB: 15-Feb-1990 DOA: 11/30/2019 PCP: Patient, No Pcp Per   Brief Narrative: HPI by Dr. Dairl Ponder This is a 29 year old male with past medical history of HIV on [facility]ik[facility]s at [facility] s/p treatment,hypertension who presented with a left foot pain x1 day which has gradually been worsening. Unable to bear weight.  Patient stated that he was in his usual state of health until yesterday when he was leaving the [employer] store and had sudden onset of left ankle pain and inability to bear weight. Admitted to fevers up to 103 over the past 48 hours which have been declining and noted nausea, vomiting and a cough to the ED doc. Admitted to having STIs in the past including syphilis and was worked up for gonorrhea in the past but not sure if he was potitive or treated. Also admitted to some discomfort of his right elbow, wrist and right digits as well as right ankle pain. Denied any trauma to his ankle. No noted erythema or warmth. States that he has been dating a new partner for the past 2 months but also had a sexual partner before that but does not believe that either of them have any symptoms. Admits to intermittent left and right-sided chest pain. He was unaware of any of the rashes on his feet noted during my exam. Denies any penile discharge or urinary complaints. He denies any known personal or family history of any rheumatologic disorders.  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  Seen by ID patient on ceftriaxone 2 g IV daily for 14 days.  PICC line placed.  MRI left ankle concerning for septic  arthritis and tenosynovitis. Await ID input.  #2 history of essential hypertension continue Norvasc.  #3 history of HIV/syphilis follows at [facility].  On [place].    Estimated body mass index is 49.79 kg/m as calculated from the following:   Height as of this encounter: 5\' 10"  (1.778 m).   Weight as of this encounter: 157.4 kg.  DVT prophylaxis: Lovenox  code Status: Full code Family Communication: None at bedside  disposition Plan:  Status is: Inpatient   Dispo: The patient is from: Home              Anticipated d/c is to: Home              Anticipated d/c date is: 2 days              Patient currently is not medically stable to d/c.    Consultants: Infectious disease  Procedures: None Antimicrobials Rocephin  Subjective: Continues to complain of left ankle pain asking for steroid shot in the ankle  Objective: Vitals:   12/02/19 1922 12/02/19 2010 12/03/19 0514 12/03/19 1000  BP: (!) 139/95 (!) 141/98 (!) 150/83 (!) 158/102  Pulse: 75 70 67 90  Resp:  16 16   Temp:  98.6 F (37 C) 98.1 F (36.7 C)   TempSrc:  Oral Oral   SpO2:  100% 98%   Weight:      Height:        Intake/Output Summary (Last 24 hours) at  12/03/2019 1359 Last data filed at 12/03/2019 1108 Gross per 24 hour  Intake 1235.5 ml  Output --  Net 1235.5 ml   Filed Weights   11/30/19 2207  Weight: (!) 157.4 kg    Examination:  General exam: Appears calm and comfortable  Respiratory system: Clear to auscultation. Respiratory effort normal. Cardiovascular system: S1 & S2 heard, RRR. No JVD, murmurs, rubs, gallops or clicks. No pedal edema. Gastrointestinal system: Abdomen is nondistended, soft and nontender. No organomegaly or masses felt. Normal bowel sounds heard. Central nervous system: Alert and oriented. No focal neurological deficits. Extremities: Swollen left ankle Skin: Multiple erythematous lesions in the lower extremities noted Psychiatry: Judgement and insight appear normal.  Mood & affect appropriate.     Data Reviewed: I have personally reviewed following labs and imaging studies  CBC: Recent Labs  Lab 11/30/19 0436 11/30/19 1010 12/01/19 0317 12/02/19 0349  WBC 23.1* 24.2* 19.8* 15.8*  NEUTROABS 18.8*  --   --  10.6*  HGB 15.2 14.1 14.1 14.1  HCT 47.7 44.5 44.5 44.9  MCV 82.5 82.1 83.6 83.9  PLT 276 244 265 264   Basic Metabolic Panel: Recent Labs  Lab 11/30/19 0436 11/30/19 1010 12/01/19 0317 12/02/19 0349  NA 136  --  135 135  K 3.6  --  3.9 3.9  CL 98  --  101 97*  CO2 25  --  24 26  GLUCOSE 130*  --  106* 124*  BUN 12  --  10 11  CREATININE 1.17 1.00 1.05 1.03  CALCIUM 9.4  --  8.6* 9.0  MG  --   --   --  2.2   GFR: Estimated Creatinine Clearance: 159.9 mL/min (by C-G formula based on SCr of 1.03 mg/dL). Liver Function Tests: Recent Labs  Lab 11/30/19 1010  AST 17  ALT 20  ALKPHOS 64  BILITOT 1.3*  PROT 7.4  ALBUMIN 3.7   No results for input(s): LIPASE, AMYLASE in the last 168 hours. No results for input(s): AMMONIA in the last 168 hours. Coagulation Profile: No results for input(s): INR, PROTIME in the last 168 hours. Cardiac Enzymes: No results for input(s): CKTOTAL, CKMB, CKMBINDEX, TROPONINI in the last 168 hours. BNP (last 3 results) No results for input(s): PROBNP in the last 8760 hours. HbA1C: No results for input(s): HGBA1C in the last 72 hours. CBG: No results for input(s): GLUCAP in the last 168 hours. Lipid Profile: No results for input(s): CHOL, HDL, LDLCALC, TRIG, CHOLHDL, LDLDIRECT in the last 72 hours. Thyroid Function Tests: No results for input(s): TSH, T4TOTAL, FREET4, T3FREE, THYROIDAB in the last 72 hours. Anemia Panel: No results for input(s): VITAMINB12, FOLATE, FERRITIN, TIBC, IRON, RETICCTPCT in the last 72 hours. Sepsis Labs: Recent Labs  Lab 11/30/19 0263  LATICACIDVEN 0.9    Recent Results (from the past 240 hour(s))  Respiratory Panel by RT PCR (Flu A&B, Covid) -     Status:  None   Collection Time: 11/30/19  4:36 AM   Specimen: Nasopharyngeal  Result Value Ref Range Status   SARS Coronavirus 2 by RT PCR NEGATIVE NEGATIVE Final    Comment: (NOTE) SARS-CoV-2 target nucleic acids are NOT DETECTED.  The SARS-CoV-2 RNA is generally detectable in upper respiratoy specimens during the acute phase of infection. The lowest concentration of SARS-CoV-2 viral copies this assay can detect is 131 copies/mL. A negative result does not preclude SARS-Cov-2 infection and should not be used as the sole basis for treatment or other patient management decisions.  A negative result may occur with  improper specimen collection/handling, submission of specimen other than nasopharyngeal swab, presence of viral mutation(s) within the areas targeted by this assay, and inadequate number of viral copies (<131 copies/mL). A negative result must be combined with clinical observations, patient history, and epidemiological information. The expected result is Negative.  Fact Sheet for Patients:  https://www.moore.com/https://www.fda.gov/media/142436/download  Fact Sheet for Healthcare Providers:  https://www.young.biz/https://www.fda.gov/media/142435/download  This test is no t yet approved or cleared by the [place] FDA and  has been authorized for detection and/or diagnosis of SARS-CoV-2 by FDA under an Emergency Use Authorization (EUA). This EUA will remain  in effect (meaning this test can be used) for the duration of the COVID-19 declaration under Section 564(b)(1) of the Act, 21 U.S.C. section 360bbb-3(b)(1), unless the authorization is terminated or revoked sooner.     Influenza A by PCR NEGATIVE NEGATIVE Final   Influenza B by PCR NEGATIVE NEGATIVE Final    Comment: (NOTE) The Xpert Xpress SARS-CoV-2/FLU/RSV assay is intended as an aid in  the diagnosis of influenza from Nasopharyngeal swab specimens and  should not be used as a sole basis for treatment. Nasal washings and  aspirates are unacceptable for Xpert  Xpress SARS-CoV-2/FLU/RSV  testing.  Fact Sheet for Patients: https://www.moore.com/https://www.fda.gov/media/142436/download  Fact Sheet for Healthcare Providers: https://www.young.biz/https://www.fda.gov/media/142435/download  This test is not yet approved or cleared by the [place] FDA and  has been authorized for detection and/or diagnosis of SARS-CoV-2 by  FDA under an Emergency Use Authorization (EUA). This EUA will remain  in effect (meaning this test can be used) for the duration of the  Covid-19 declaration under Section 564(b)(1) of the Act, 21  U.S.C. section 360bbb-3(b)(1), unless the authorization is  terminated or revoked. Performed at [facility], [address] [place], [place] [address]   Culture, blood (routine x 2)     Status: None (Preliminary result)   Collection Time: 11/30/19  9:40 AM   Specimen: BLOOD LEFT HAND  Result Value Ref Range Status   Specimen Description   Final    BLOOD LEFT HAND Performed at [facility], [address] [place], [place] [address]    Special Requests   Final    BOTTLES DRAWN AEROBIC ONLY Blood Culture adequate volume Performed at [facility], [address] [place], [place] [address]    Culture   Final    NO GROWTH 3 DAYS Performed at [facility], [address] [place], [place] [address]    Report Status PENDING  Incomplete  Culture, blood (routine x 2)     Status: None (Preliminary result)   Collection Time: 11/30/19 10:12 AM   Specimen: BLOOD RIGHT HAND  Result Value Ref Range Status   Specimen Description   Final    BLOOD RIGHT HAND Performed at [facility], [address] [place], [place] [address]    Special Requests   Final    BOTTLES DRAWN AEROBIC AND ANAEROBIC Blood Culture adequate volume Performed at [facility], [address] [place], [place] [address]    Culture   Final    NO GROWTH 3 DAYS Performed at [facility],  [address] [place], [place] [address]    Report Status PENDING  Incomplete         Radiology Studies: MR ANKLE LEFT W WO CONTRAST  Result Date: 12/03/2019 CLINICAL DATA:  Left ankle pain.  Disseminated gonococcal infection. EXAM:  MRI OF THE LEFT ANKLE WITHOUT AND WITH CONTRAST TECHNIQUE: Multiplanar, multisequence MR imaging of the ankle was performed before and after the administration of intravenous contrast. CONTRAST:  22mL GADAVIST GADOBUTROL 1 MMOL/ML IV SOLN COMPARISON:  Left foot x-rays dated November 30, 2019. FINDINGS: TENDONS Peroneal: Peroneal longus tendon intact. Peroneal brevis intact. Posteromedial: Posterior tibial tendon intact. Flexor hallucis longus tendon intact with prominent fluid in the tendon sheath extending from the level of the tibiotalar joint to the master knot of Sherilyn Cooter. There is associated synovial enhancement. Flexor digitorum longus tendon intact. Anterior: Tibialis anterior tendon intact. Extensor hallucis longus tendon intact Extensor digitorum longus tendon intact. Achilles:  Intact. Plantar Fascia: Intact. LIGAMENTS Lateral: Anterior talofibular ligament intact. Calcaneofibular ligament intact. Posterior talofibular ligament intact. Anterior and posterior tibiofibular ligaments intact. Medial: Deltoid ligament intact. Spring ligament intact. CARTILAGE Ankle Joint: Moderate joint effusion with synovial enhancement. Normal ankle mortise. No chondral defect. Subtalar Joints/Sinus Tarsi: Normal subtalar joints. Moderate posterior subtalar joint effusion with synovial enhancement. Normal sinus tarsi. Bones: No marrow signal abnormality. No fracture or dislocation. Os trigonum. Soft Tissue: Mild diffuse soft tissue swelling. No soft tissue mass or fluid collection. IMPRESSION: IMPRESSION 1. Moderate tibiotalar and posterior subtalar joint effusions with synovial enhancement, concerning for septic arthritis. 2. Infectious flexor hallucis longus tenosynovitis extending from the  level of the tibiotalar joint to the master knot of Sherilyn Cooter. 3. No osteomyelitis or abscess. Electronically Signed   By: Obie Dredge M.D.   On: 12/03/2019 11:37   [place] EKG SITE RITE  Result Date: 12/03/2019 If Site Rite image not attached, placement could not be confirmed due to current cardiac rhythm.  [place] EKG SITE RITE  Result Date: 12/02/2019 If Site Rite image not attached, placement could not be confirmed due to current cardiac rhythm.       Scheduled Meds: . amLODipine  10 mg Oral Daily  . aspirin EC  81 mg Oral Daily  . bictegravir-emtricitabine-tenofovir AF  1 tablet Oral Daily  . Chlorhexidine Gluconate Cloth  6 each Topical Daily  . dextromethorphan-guaiFENesin  1 tablet Oral BID  . enoxaparin (LOVENOX) injection  80 mg Subcutaneous Q24H  . metoprolol tartrate  12.5 mg Oral BID  . pantoprazole  40 mg Oral Daily  . sodium chloride flush  10-40 mL Intracatheter Q12H   Continuous Infusions: . sodium chloride Stopped (12/03/19 1108)  . cefTRIAXone (ROCEPHIN)  IV 2 g (12/03/19 1005)     LOS: 2 days   Alwyn Ren, MD  12/03/2019, 1:59 PM

## 2019-12-03 NOTE — Progress Notes (Signed)
PHARMACY CONSULT NOTE FOR:  OUTPATIENT  PARENTERAL ANTIBIOTIC THERAPY (OPAT)  Indication: Disseminated gonorrhea Regimen: Ceftriaxone 2 gm IV Q 24 hours End date: 12/13/2019  IV antibiotic discharge orders are pended. To discharging provider:  please sign these orders via discharge navigator,  Select New Orders & click on the button choice - Manage This Unsigned Work.     Thank you for allowing pharmacy to be a part of this patient's care.  Sharin Mons, PharmD, BCPS, BCIDP Infectious Diseases Clinical Pharmacist Phone: [phone] 12/03/2019, 5:34 PM

## 2019-12-03 NOTE — Progress Notes (Signed)
Peripherally Inserted Central Catheter Placement  The IV Nurse has discussed with the patient and/or persons authorized to consent for the patient, the purpose of this procedure and the potential benefits and risks involved with this procedure.  The benefits include less needle sticks, lab draws from the catheter, and the patient may be discharged home with the catheter. Risks include, but not limited to, infection, bleeding, blood clot (thrombus formation), and puncture of an artery; nerve damage and irregular heartbeat and possibility to perform a PICC exchange if needed/ordered by physician.  Alternatives to this procedure were also discussed.  Bard Power PICC patient education guide, fact sheet on infection prevention and patient information card has been provided to patient /or left at bedside.    PICC Placement Documentation  PICC Single Lumen 12/03/19 PICC Right Cephalic 45 cm 0 cm (Active)  Indication for Insertion or Continuance of Line Home intravenous therapies (PICC only) 12/03/19 1000  Exposed Catheter (cm) 0 cm 12/03/19 1000  Site Assessment Clean;Dry;Intact 12/03/19 1000  Line Status Flushed;Saline locked;Blood return noted 12/03/19 1000  Dressing Type Transparent;Securing device 12/03/19 1000  Dressing Status Clean;Dry;Intact 12/03/19 1000  Antimicrobial disc in place? Yes 12/03/19 1000  Safety Lock Not Applicable 12/03/19 1000  Line Care Connections checked and tightened 12/03/19 1000  Dressing Intervention New dressing 12/03/19 1000  Dressing Change Due 12/10/19 12/03/19 1000       Jimmy Fields [name] 12/03/2019, 10:49 AM

## 2019-12-04 DIAGNOSIS — A5486 Gonococcal sepsis: Secondary | ICD-10-CM | POA: Diagnosis not present

## 2019-12-04 MED ORDER — KETOROLAC TROMETHAMINE 15 MG/ML IJ SOLN
15.0000 mg | Freq: Four times a day (QID) | INTRAMUSCULAR | Status: DC | PRN
  Administered 2019-12-05 – 2019-12-06 (×2): 15 mg via INTRAVENOUS
  Filled 2019-12-04 (×2): qty 1

## 2019-12-04 MED ORDER — CEFTRIAXONE IV (FOR PTA / DISCHARGE USE ONLY): 2.0000 g | INTRAVENOUS | 0 refills | Status: AC

## 2019-12-04 NOTE — Progress Notes (Signed)
PROGRESS NOTE    Marisue BrooklynDarrian Conaway  BJY:[id number]:[id number] DOB: May 04, 1990 DOA: 11/30/2019 PCP: Patient, No Pcp Per   Brief Narrative: HPI by Dr. Dairl PonderSegal This is a 29 year old male with past medical history of [facility],syphilis s/p treatment,hypertension who presented with a left foot pain x1 day which has gradually been worsening. Unable to bear weight.  Patient stated that he was in his usual state of health until yesterday when he was leaving the [employer] store and had sudden onset of left ankle pain and inability to bear weight. Admitted to fevers up to 103 over the past 48 hours which have been declining and noted nausea, vomiting and a cough to the ED doc. Admitted to having STIs in the past including syphilis and was worked up for gonorrhea in the past but not sure if he was potitive or treated. Also admitted to some discomfort of his right elbow, wrist and right digits as well as right ankle pain. Denied any trauma to his ankle. No noted erythema or warmth. States that he has been dating a new partner for the past 2 months but also had a sexual partner before that but does not believe that either of them have any symptoms. Admits to intermittent left and right-sided chest pain. He was unaware of any of the rashes on his feet noted during my exam. Denies any penile discharge or urinary complaints. He denies any known personal or family history of any rheumatologic disorders.  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  Seen by ID patient on ceftriaxone 2 g IV daily for 14 days.  PICC line placed.  MRI left ankle concerning for septic  arthritis and tenosynovitis. Consulted IR and Dr. Ophelia CharterYates. DME ordered  #2 history of essential hypertension continue Norvasc.  #3 history of [facility].  On GrahamBiktarvy.    Estimated body mass index is 49.79 kg/m as calculated from the following:   Height as of this encounter: 5\' 10"  (1.778 m).   Weight as of this encounter: 157.4 kg.  DVT prophylaxis: Lovenox  code Status: Full code Family Communication: None at bedside  disposition Plan:  Status is: Inpatient   Dispo: The patient is from: Home              Anticipated d/c is to: Home              Anticipated d/c date is: 2 days              Patient currently is not medically stable to d/c.    Consultants: Infectious disease  Procedures: None Antimicrobials Rocephin  Subjective: Continues to complain of severe pain in left ankle worried about how he will get up to the second floor of his apartment His partner gets off work by only 6:45 PM every day His partner requesting to be trained by physical therapy prior to discharge Of told him to ask his partner to come during lunchtime so PT can show him how to work with the patient  Objective: Vitals:   12/03/19 1542 12/03/19 2155 12/04/19 0635 12/04/19 1349  BP: (!) 146/92 (!) 157/89 (!) 152/89 138/79  Pulse: 74 80 66 70  Resp: 18 18 16 16   Temp: 98.2 F (36.8 C) 98.2 F (36.8 C) 98 F (36.7 C) 97.8 F (36.6 C)  TempSrc: Oral  Oral Oral  SpO2: 100% 98% 100% 99%  Weight:      Height:        Intake/Output Summary (Last 24 hours) at 12/04/2019 1456 Last data filed at 12/04/2019 1000 Gross per 24 hour  Intake 600 ml  Output --  Net 600 ml   Filed Weights   11/30/19 2207  Weight: (!) 157.4 kg    Examination:  General exam: Appears calm and comfortable  Respiratory system: Clear to auscultation. Respiratory effort normal. Cardiovascular system: S1 & S2 heard, RRR. No JVD, murmurs, rubs, gallops or clicks. No pedal edema. Gastrointestinal  system: Abdomen is nondistended, soft and nontender. No organomegaly or masses felt. Normal bowel sounds heard. Central nervous system: Alert and oriented. No focal neurological deficits. Extremities: Swollen left ankle Skin: Multiple erythematous lesions in the lower extremities noted Psychiatry: Judgement and insight appear normal. Mood & affect appropriate.     Data Reviewed: I have personally reviewed following labs and imaging studies  CBC: Recent Labs  Lab 11/30/19 0436 11/30/19 1010 12/01/19 0317 12/02/19 0349  WBC 23.1* 24.2* 19.8* 15.8*  NEUTROABS 18.8*  --   --  10.6*  HGB 15.2 14.1 14.1 14.1  HCT 47.7 44.5 44.5 44.9  MCV 82.5 82.1 83.6 83.9  PLT 276 244 265 264   Basic Metabolic Panel: Recent Labs  Lab 11/30/19 0436 11/30/19 1010 12/01/19 0317 12/02/19 0349  NA 136  --  135 135  K 3.6  --  3.9 3.9  CL 98  --  101 97*  CO2 25  --  24 26  GLUCOSE 130*  --  106* 124*  BUN 12  --  10 11  CREATININE 1.17 1.00 1.05 1.03  CALCIUM 9.4  --  8.6* 9.0  MG  --   --   --  2.2   GFR: Estimated Creatinine Clearance: 159.9 mL/min (by C-G formula based on SCr of 1.03 mg/dL). Liver Function Tests: Recent Labs  Lab 11/30/19 1010  AST 17  ALT 20  ALKPHOS 64  BILITOT 1.3*  PROT 7.4  ALBUMIN 3.7   No results for input(s): LIPASE, AMYLASE in the last 168 hours. No results for input(s): AMMONIA in the last 168 hours. Coagulation Profile: No results for input(s): INR, PROTIME in the last 168 hours. Cardiac Enzymes: No results for input(s): CKTOTAL, CKMB, CKMBINDEX, TROPONINI in the last 168 hours. BNP (last 3 results) No results for input(s): PROBNP in the last 8760 hours. HbA1C: No results for input(s): HGBA1C in the last 72 hours. CBG: No results for input(s): GLUCAP in the last 168 hours. Lipid Profile: No results for input(s): CHOL, HDL, LDLCALC, TRIG, CHOLHDL, LDLDIRECT in the last 72 hours. Thyroid Function Tests: No results for input(s): TSH, T4TOTAL,  FREET4, T3FREE, THYROIDAB in the last 72 hours. Anemia Panel: No results for input(s): VITAMINB12, FOLATE, FERRITIN, TIBC, IRON, RETICCTPCT in the last 72 hours. Sepsis Labs: Recent Labs  Lab 11/30/19 13/01/21  LATICACIDVEN 0.9    Recent Results (from the past 240 hour(s))  Respiratory Panel by RT PCR (Flu A&B, Covid) -     Status: None   Collection Time: 11/30/19  4:36 AM   Specimen: Nasopharyngeal  Result Value Ref Range Status   SARS Coronavirus 2 by RT PCR NEGATIVE NEGATIVE Final    Comment: (NOTE)  SARS-CoV-2 target nucleic acids are NOT DETECTED.  The SARS-CoV-2 RNA is generally detectable in upper respiratoy specimens during the acute phase of infection. The lowest concentration of SARS-CoV-2 viral copies this assay can detect is 131 copies/mL. A negative result does not preclude SARS-Cov-2 infection and should not be used as the sole basis for treatment or other patient management decisions. A negative result may occur with  improper specimen collection/handling, submission of specimen other than nasopharyngeal swab, presence of viral mutation(s) within the areas targeted by this assay, and inadequate number of viral copies (<131 copies/mL). A negative result must be combined with clinical observations, patient history, and epidemiological information. The expected result is Negative.  Fact Sheet for Patients:  https://www.moore.com/  Fact Sheet for Healthcare Providers:  https://www.young.biz/  This test is no t yet approved or cleared by the [place] FDA and  has been authorized for detection and/or diagnosis of SARS-CoV-2 by FDA under an Emergency Use Authorization (EUA). This EUA will remain  in effect (meaning this test can be used) for the duration of the COVID-19 declaration under Section 564(b)(1) of the Act, 21 U.S.C. section 360bbb-3(b)(1), unless the authorization is terminated or revoked sooner.     Influenza A  by PCR NEGATIVE NEGATIVE Final   Influenza B by PCR NEGATIVE NEGATIVE Final    Comment: (NOTE) The Xpert Xpress SARS-CoV-2/FLU/RSV assay is intended as an aid in  the diagnosis of influenza from Nasopharyngeal swab specimens and  should not be used as a sole basis for treatment. Nasal washings and  aspirates are unacceptable for Xpert Xpress SARS-CoV-2/FLU/RSV  testing.  Fact Sheet for Patients: https://www.moore.com/  Fact Sheet for Healthcare Providers: https://www.young.biz/  This test is not yet approved or cleared by the [place] FDA and  has been authorized for detection and/or diagnosis of SARS-CoV-2 by  FDA under an Emergency Use Authorization (EUA). This EUA will remain  in effect (meaning this test can be used) for the duration of the  Covid-19 declaration under Section 564(b)(1) of the Act, 21  U.S.C. section 360bbb-3(b)(1), unless the authorization is  terminated or revoked. Performed at [facility], [address] [place], [place] [address]   Culture, blood (routine x 2)     Status: None (Preliminary result)   Collection Time: 11/30/19  9:40 AM   Specimen: BLOOD LEFT HAND  Result Value Ref Range Status   Specimen Description   Final    BLOOD LEFT HAND Performed at [facility], [address] [place], [place] [address]    Special Requests   Final    BOTTLES DRAWN AEROBIC ONLY Blood Culture adequate volume Performed at [facility], [address] [place], [place] [address]    Culture   Final    NO GROWTH 4 DAYS Performed at [facility], [address] [place], [place] [address]    Report Status PENDING  Incomplete  Culture, blood (routine x 2)     Status: None (Preliminary result)   Collection Time: 11/30/19 10:12 AM   Specimen: BLOOD RIGHT HAND  Result Value Ref Range Status   Specimen Description   Final    BLOOD RIGHT HAND Performed at [facility], [address] [place], [place] [address]    Special Requests   Final    BOTTLES DRAWN AEROBIC AND ANAEROBIC Blood Culture adequate volume Performed at [facility], [address], [place], [place] [address]    Culture  Final    NO GROWTH 4 DAYS Performed at [facility], [address], [place], [place] [address]    Report Status PENDING  Incomplete         Radiology Studies: MR ANKLE LEFT W WO CONTRAST  Result Date: 12/03/2019 CLINICAL DATA:  Left ankle pain.  Disseminated gonococcal infection. EXAM: MRI OF THE LEFT ANKLE WITHOUT AND WITH CONTRAST TECHNIQUE: Multiplanar, multisequence MR imaging of the ankle was performed before and after the administration of intravenous contrast. CONTRAST:  62mL GADAVIST GADOBUTROL 1 MMOL/ML IV SOLN COMPARISON:  Left foot x-rays dated November 30, 2019. FINDINGS: TENDONS Peroneal: Peroneal longus tendon intact. Peroneal brevis intact. Posteromedial: Posterior tibial tendon intact. Flexor hallucis longus tendon intact with prominent fluid in the tendon sheath extending from the level of the tibiotalar joint to the master knot of Sherilyn Cooter. There is associated synovial enhancement. Flexor digitorum longus tendon intact. Anterior: Tibialis anterior tendon intact. Extensor hallucis longus tendon intact Extensor digitorum longus tendon intact. Achilles:  Intact. Plantar Fascia: Intact. LIGAMENTS Lateral: Anterior talofibular ligament intact. Calcaneofibular ligament intact. Posterior talofibular ligament intact. Anterior and posterior tibiofibular ligaments intact. Medial: Deltoid ligament intact. Spring ligament intact. CARTILAGE Ankle Joint: Moderate joint effusion with synovial enhancement. Normal ankle mortise. No chondral defect. Subtalar Joints/Sinus Tarsi: Normal subtalar joints. Moderate posterior subtalar joint effusion with synovial enhancement. Normal sinus tarsi. Bones: No marrow signal abnormality. No  fracture or dislocation. Os trigonum. Soft Tissue: Mild diffuse soft tissue swelling. No soft tissue mass or fluid collection. IMPRESSION: IMPRESSION 1. Moderate tibiotalar and posterior subtalar joint effusions with synovial enhancement, concerning for septic arthritis. 2. Infectious flexor hallucis longus tenosynovitis extending from the level of the tibiotalar joint to the master knot of Sherilyn Cooter. 3. No osteomyelitis or abscess. Electronically Signed   By: Obie Dredge M.D.   On: 12/03/2019 11:37   [place] EKG SITE RITE  Result Date: 12/03/2019 If Site Rite image not attached, placement could not be confirmed due to current cardiac rhythm.       Scheduled Meds: . amLODipine  10 mg Oral Daily  . aspirin EC  81 mg Oral Daily  . bictegravir-emtricitabine-tenofovir AF  1 tablet Oral Daily  . Chlorhexidine Gluconate Cloth  6 each Topical Daily  . dextromethorphan-guaiFENesin  1 tablet Oral BID  . enoxaparin (LOVENOX) injection  80 mg Subcutaneous Q24H  . metoprolol tartrate  12.5 mg Oral BID  . pantoprazole  40 mg Oral Daily  . sodium chloride flush  10-40 mL Intracatheter Q12H   Continuous Infusions: . sodium chloride Stopped (12/03/19 1108)  . cefTRIAXone (ROCEPHIN)  IV 2 g (12/04/19 1143)     LOS: 3 days   Alwyn Ren, MD  12/04/2019, 2:56 PM

## 2019-12-04 NOTE — Progress Notes (Signed)
Full consult to follow. Left ankle aspirated 15 cc yellow serous with trace cloudy.   Sent for cell count, diff and crystals. Culture sent in culture tube for cultures. He tolerated aspiration well.

## 2019-12-04 NOTE — Progress Notes (Signed)
ID Pharmacy Note   Patient is awaiting prior authorization on Ceftriaxone prior to discharge.  Prior Authorization submitted 11/4. Case number [id number]  Phone number for insurance: [phone]   Sharin Mons, PharmD, BCPS, BCIDP Infectious Diseases Clinical Pharmacist Phone: [phone] 12/04/2019 1:59 PM

## 2019-12-04 NOTE — Plan of Care (Signed)
  Problem: Clinical Measurements: Goal: Diagnostic test results will improve Outcome: Progressing   Problem: Coping: Goal: Level of anxiety will decrease Outcome: Progressing   Problem: Pain Managment: Goal: General experience of comfort will improve Outcome: Progressing   

## 2019-12-04 NOTE — Progress Notes (Signed)
[facility]    Date of Admission:  11/30/2019   Total days of antibiotics 5           ID: Jimmy Fields is a 29 y.o. male with disseminated Gonococcal infection with left ankle septic arthritis Principal Problem:   DGI (disseminated gonococcal infection) (HCC) Active Problems:   HIV (human immunodeficiency virus infection) (HCC)   Essential hypertension   Polyarthralgia   Acute left ankle pain   Joint pain    Subjective: Afebrile, some improvement with left ankle, now able to bear weight with walker  Medications:  . amLODipine  10 mg Oral Daily  . aspirin EC  81 mg Oral Daily  . bictegravir-emtricitabine-tenofovir AF  1 tablet Oral Daily  . Chlorhexidine Gluconate Cloth  6 each Topical Daily  . dextromethorphan-guaiFENesin  1 tablet Oral BID  . enoxaparin (LOVENOX) injection  80 mg Subcutaneous Q24H  . metoprolol tartrate  12.5 mg Oral BID  . pantoprazole  40 mg Oral Daily  . sodium chloride flush  10-40 mL Intracatheter Q12H    Objective: Vital signs in last 24 hours: Temp:  [97.8 F (36.6 C)-98.2 F (36.8 C)] 97.8 F (36.6 C) (11/05 1349) Pulse Rate:  [66-80] 70 (11/05 1349) Resp:  [16-18] 16 (11/05 1349) BP: (138-157)/(79-89) 138/79 (11/05 1349) SpO2:  [98 %-100 %] 99 % (11/05 1349) Physical Exam  Constitutional: He is oriented to person, place, and time. He appears well-developed and well-nourished. No distress.  HENT:  Mouth/Throat: Oropharynx is clear and moist. No oropharyngeal exudate.  Cardiovascular: Normal rate, regular rhythm and normal heart sounds. Exam reveals no gallop and no friction rub.  No murmur heard.  Pulmonary/Chest: Effort normal and breath sounds normal. No respiratory distress. He has no wheezes.  Abdominal: Soft. Bowel sounds are normal. He exhibits no distension. There is no tenderness.  Lymphadenopathy:  He has no cervical adenopathy.  Neurological: He is alert and oriented to person, place, and time.    Skin: Skin is warm and dry. No rash noted. No erythema.  Psychiatric: He has a normal mood and affect. His behavior is normal.     Lab Results Recent Labs    12/02/19 0349  WBC 15.8*  HGB 14.1  HCT 44.9  NA 135  K 3.9  CL 97*  CO2 26  BUN 11  CREATININE 1.03    Microbiology: blod cx ngtd Studies/Results: MR ANKLE LEFT W WO CONTRAST  Result Date: 12/03/2019 CLINICAL DATA:  Left ankle pain.  Disseminated gonococcal infection. EXAM: MRI OF THE LEFT ANKLE WITHOUT AND WITH CONTRAST TECHNIQUE: Multiplanar, multisequence MR imaging of the ankle was performed before and after the administration of intravenous contrast. CONTRAST:  62mL GADAVIST GADOBUTROL 1 MMOL/ML IV SOLN COMPARISON:  Left foot x-rays dated November 30, 2019. FINDINGS: TENDONS Peroneal: Peroneal longus tendon intact. Peroneal brevis intact. Posteromedial: Posterior tibial tendon intact. Flexor hallucis longus tendon intact with prominent fluid in the tendon sheath extending from the level of the tibiotalar joint to the master knot of Sherilyn Cooter. There is associated synovial enhancement. Flexor digitorum longus tendon intact. Anterior: Tibialis anterior tendon intact. Extensor hallucis longus tendon intact Extensor digitorum longus tendon intact. Achilles:  Intact. Plantar Fascia: Intact. LIGAMENTS Lateral: Anterior talofibular ligament intact. Calcaneofibular ligament intact. Posterior talofibular ligament intact. Anterior and posterior tibiofibular ligaments intact. Medial: Deltoid ligament intact. Spring ligament intact. CARTILAGE Ankle Joint: Moderate joint effusion with synovial enhancement. Normal ankle mortise. No chondral defect. Subtalar Joints/Sinus Tarsi: Normal subtalar joints.  Moderate posterior subtalar joint effusion with synovial enhancement. Normal sinus tarsi. Bones: No marrow signal abnormality. No fracture or dislocation. Os trigonum. Soft Tissue: Mild diffuse soft tissue swelling. No soft tissue mass or fluid  collection. IMPRESSION: IMPRESSION 1. Moderate tibiotalar and posterior subtalar joint effusions with synovial enhancement, concerning for septic arthritis. 2. Infectious flexor hallucis longus tenosynovitis extending from the level of the tibiotalar joint to the master knot of Sherilyn Cooter. 3. No osteomyelitis or abscess. Electronically Signed   By: Obie Dredge M.D.   On: 12/03/2019 11:37   [place] EKG SITE RITE  Result Date: 12/03/2019 If Site Rite image not attached, placement could not be confirmed due to current cardiac rhythm.    Assessment/Plan: 29yo M with well controlled hiv disease admitted for polyarticular arthritis found to have disseminated gonococcal infection  Mri confirmed septic arthritis - recommend to see if surgery thinks needs washout vs abtx alone. Appears to start to have some improvement. Will recommend 2 wk of IV ceftriaxone, and may need orals thereafter.    will follow up in the ID clinic  hiv disease = continue on biktarvy  Health maintenance = if he has not had covid vaccine, recommend for him to get a dose.  [facility] Cell: [phone] Pager: [phone]  12/04/2019, 4:23 PM

## 2019-12-04 NOTE — Progress Notes (Addendum)
Physical Therapy Treatment Patient Details Name: Jimmy Fields MRN: [id number] DOB: 1990-08-22 Today's Date: 12/04/2019    History of Present Illness Patient is a 29 year old male with past medical history of HIV on Biktarvy follows at [facility], syphilis s/p treatment, hypertension who presented with a left foot pain x1 day which has gradually been worsening.  Unable to bear weight.    PT Comments    Pt found relaxing leisurely on bed. When asked about pain, he says his L foot pain fluctuates between 8 and 10. He is supervision with bed mobility; easily able to get to EOB. He is unable to don L shoe due to swelling. Min A to stand with RW, required elevated bed surface in order to get to standing. Ambulates on lateral aspect of B feet secondary to pain. Min A for gait, as pt qait sequence had to be corrected; "step with hurt foot first". +2 with chair follow for safety. Ambulates ~23ft to stairwell to practice stairs. Pt is educated how to safely ascend/descend stairs on his bottom, being that he has 20 stairs to enter his apartment building. He demonstrates good understanding of technique. Chooses to ambulate back to room instead of riding in chair. S/o fatigue at end of session. He inquires about receiving a WC for D/C to home; nursing is notified. He is pleased with how well he ambulated today compared to yesterday, per his statement. Obvious increase in endurance and pain management as demonstrated by his improved ability to complete functional tasks.  Follow Up Recommendations  No PT follow up     Equipment Recommendations  Rolling walker with 5" wheels;3in1 (PT)    Recommendations for Other Services       Precautions / Restrictions Precautions Precautions: Fall Restrictions Weight Bearing Restrictions: No    Mobility  Bed Mobility Overal bed mobility: Needs Assistance Bed Mobility: Supine to Sit     Supine to sit: Supervision     General bed mobility comments: no assist  required to sit up EOB.  Transfers Overall transfer level: Needs assistance Equipment used: Rolling walker (2 wheeled) Transfers: Sit to/from [employer] Sit to Stand: From elevated surface;Min assist            Ambulation/Gait Ambulation/Gait assistance: Min assist;+2 safety/equipment Gait Distance (Feet): 40 Feet Assistive device: Rolling walker (2 wheeled) Gait Pattern/deviations: Decreased stride length;Step-through pattern;Antalgic;Decreased stance time - left         Stairs  Stairs assistance: Supervision  Stairs completed: 8  Assistive Device: None  Instructed on scooting up/down stairs due to decreased ability to tolerate WB in affected LE           Wheelchair Mobility    Modified Rankin (Stroke Patients Only)       Balance                                            Cognition Arousal/Alertness: Awake/alert Behavior During Therapy: WFL for tasks assessed/performed Overall Cognitive Status: Within Functional Limits for tasks assessed                                        Exercises General Exercises - Lower Extremity Ankle Circles/Pumps: AROM;10 reps;Supine;Both Heel Slides: AROM;10 reps;Supine;Left Straight Leg Raises: AROM;5 reps;Left Other Exercises Other Exercises: Toe flexion,  toe extension, toe scrunch, ankle circles x 5 reps each - to increase circulation for optimal retrieval of antibiotic medication    General Comments        Pertinent Vitals/Pain Pain Assessment: 0-10 Pain Score: 8  (Pt claims pain fluctuates between 8 and 10) Pain Location: Lt foot Pain Descriptors / Indicators: Aching;Discomfort;Guarding Pain Intervention(s): Monitored during session;Ice applied    Home Living Family/patient expects to be discharged to:: Private residence Living Arrangements: Non-relatives/Friends Available Help at Discharge: Friend(s) Type of Home: Apartment Home Access: Stairs to enter             Prior Function            PT Goals (current goals can now be found in the care plan section) Acute Rehab PT Goals Patient Stated Goal: return home and be able to walk indepenently again PT Goal Formulation: With patient Time For Goal Achievement: 12/17/19 Potential to Achieve Goals: Good Progress towards PT goals: Progressing toward goals    Frequency    Min 4X/week      PT Plan      Co-evaluation              AM-PAC PT "6 Clicks" Mobility   Outcome Measure  Help needed turning from your back to your side while in a flat bed without using bedrails?: None Help needed moving from lying on your back to sitting on the side of a flat bed without using bedrails?: None Help needed moving to and from a bed to a chair (including a wheelchair)?: None Help needed standing up from a chair using your arms (e.g., wheelchair or bedside chair)?: A Little Help needed to walk in hospital room?: A Little Help needed climbing 3-5 steps with a railing? : A Lot 6 Click Score: 20    End of Session Equipment Utilized During Treatment: Gait belt Activity Tolerance: Patient tolerated treatment well Patient left: in chair;with call bell/phone within reach;with nursing/sitter in room Nurse Communication: Mobility status PT Visit Diagnosis: Difficulty in walking, not elsewhere classified (R26.2);Pain;Other abnormalities of gait and mobility (R26.89) Pain - Right/Left: Left Pain - part of body: Ankle and joints of foot     Time: 2263-3354 PT Time Calculation (min) (ACUTE ONLY): 28 min  Charges:  $Gait Training: 8-22 mins $Therapeutic Activity: 8-22 mins                     C. Alinda Dooms, SPTA [facility] [phone]

## 2019-12-04 NOTE — Consult Note (Signed)
Reason for Consult:left Ankle pyarthrosis Referring Physician: [name] MD  Jimmy Fields is an 29 y.o. male.  HPI: 29 yo male with HIV on biktarvy , previous STI syphilis s/p treatment with left foot and ankle pain times 6 days . Increased temp. Pain and inability to ambulate. Uric acid 6.3 was 7.3 on 11/30/19 . No Hx of gout , father had gout. Some symptoms in right wrist, right fingers , right elbow but not as bad as right ankle.  Past Medical History:  Diagnosis Date  . HIV (human immunodeficiency virus infection) (HCC)   . Hypertension     Past Surgical History:  Procedure Laterality Date  . LAPAROSCOPIC APPENDECTOMY N/A 10/31/2017   Procedure: APPENDECTOMY LAPAROSCOPIC;  Surgeon: Glenna Fellows, MD;  Location: [facility];  Service: General;  Laterality: N/A;    Family History  Problem Relation Age of Onset  . Heart failure Father     Social History:  reports that he has quit smoking. His smoking use included cigarettes. He has never used smokeless tobacco. He reports current alcohol use. He reports current drug use. Frequency: 14.00 times per week. Drug: Marijuana.  Allergies:  Allergies  Allergen Reactions  . Fish Allergy Nausea And Vomiting  . Shellfish Allergy Hives and Nausea And Vomiting    Medications: I have reviewed the patient's current medications.  No results found for this or any previous visit (from the past 48 hour(s)).  MR ANKLE LEFT W WO CONTRAST  Result Date: 12/03/2019 CLINICAL DATA:  Left ankle pain.  Disseminated gonococcal infection. EXAM: MRI OF THE LEFT ANKLE WITHOUT AND WITH CONTRAST TECHNIQUE: Multiplanar, multisequence MR imaging of the ankle was performed before and after the administration of intravenous contrast. CONTRAST:  56mL GADAVIST GADOBUTROL 1 MMOL/ML IV SOLN COMPARISON:  Left foot x-rays dated November 30, 2019. FINDINGS: TENDONS Peroneal: Peroneal longus tendon intact. Peroneal brevis intact. Posteromedial: Posterior tibial  tendon intact. Flexor hallucis longus tendon intact with prominent fluid in the tendon sheath extending from the level of the tibiotalar joint to the master knot of Sherilyn Cooter. There is associated synovial enhancement. Flexor digitorum longus tendon intact. Anterior: Tibialis anterior tendon intact. Extensor hallucis longus tendon intact Extensor digitorum longus tendon intact. Achilles:  Intact. Plantar Fascia: Intact. LIGAMENTS Lateral: Anterior talofibular ligament intact. Calcaneofibular ligament intact. Posterior talofibular ligament intact. Anterior and posterior tibiofibular ligaments intact. Medial: Deltoid ligament intact. Spring ligament intact. CARTILAGE Ankle Joint: Moderate joint effusion with synovial enhancement. Normal ankle mortise. No chondral defect. Subtalar Joints/Sinus Tarsi: Normal subtalar joints. Moderate posterior subtalar joint effusion with synovial enhancement. Normal sinus tarsi. Bones: No marrow signal abnormality. No fracture or dislocation. Os trigonum. Soft Tissue: Mild diffuse soft tissue swelling. No soft tissue mass or fluid collection. IMPRESSION: IMPRESSION 1. Moderate tibiotalar and posterior subtalar joint effusions with synovial enhancement, concerning for septic arthritis. 2. Infectious flexor hallucis longus tenosynovitis extending from the level of the tibiotalar joint to the master knot of Sherilyn Cooter. 3. No osteomyelitis or abscess. Electronically Signed   By: Obie Dredge M.D.   On: 12/03/2019 11:37   [place] EKG SITE RITE  Result Date: 12/03/2019 If Site Rite image not attached, placement could not be confirmed due to current cardiac rhythm.   ROSpos. For HIVon medication , HTN. Other non contributory Blood pressure 138/79, pulse 70, temperature 97.8 F (36.6 C), temperature source Oral, resp. rate 16, height 5\' 10"  (1.778 m), weight (!) 157.4 kg, SpO2 99 %. Physical Exam Constitutional:      Appearance: Normal  appearance.  HENT:     Head: Normocephalic.     Right  Ear: External ear normal.     Left Ear: External ear normal.  Eyes:     Extraocular Movements: Extraocular movements intact.  Cardiovascular:     Rate and Rhythm: Normal rate.  Pulmonary:     Breath sounds: No wheezing or rhonchi.  Musculoskeletal:        General: Swelling and tenderness present.     Cervical back: Normal range of motion.     Comments: Left ankle tender , mild effusion  Neurological:     Mental Status: He is alert.  Psychiatric:        Mood and Affect: Mood normal.   ankle mild effusion , tender , some pain with ankle ROM.   Assessment/Plan: Informed consent , left ankle aspirated , yellow with slight cloudy , no obvious pus.  Sent for crystals, cell count , cultures. On Rocephin , will follow.  Jimmy Fields 12/04/2019, 8:17 PM

## 2019-12-04 NOTE — TOC Progression Note (Addendum)
Transition of Care [facility]) - Progression Note    Patient Details  Name: Jimmy Fields MRN: [id number] Date of Birth: Oct 20, 1990  Transition of Care [facility]) CM/SW Contact  Clearance Coots, LCSW Phone Number: 12/04/2019, 11:24 AM  Clinical Narrative:    DME -manuel wheelchair, 3 IN1 and RW ordered through [employer]. The DME will be delivered to the patient bedside prior to discharge.  [employer] Authorization  is pending approval for home antibiotics. [employer] staff Jeri Modena will notify the medical team when the authorization is received.   Addendum-pt. Declined [employer], 3 in1.    Expected Discharge Plan: Home w Home Health Services Barriers to Discharge: Insurance Authorization  Expected Discharge Plan and Services Expected Discharge Plan: Home w Home Health Services In-house Referral: Clinical Social Work Discharge Planning Services: CM Consult Post Acute Care Choice: Home Health Living arrangements for the past 2 months: Apartment Expected Discharge Date: 12/04/19               DME Arranged: 3-N-1, Dan Humphreys rolling, Wheelchair manual DME Agency: [employer] Date DME Agency Contacted: 12/04/19 Time DME Agency Contacted: [phone] Representative spoke with at DME Agency: Velna Hatchet HH Arranged: RN, IV Antibiotics HH Agency: [facility], [employer] Date [facility] Agency Contacted: 12/02/19 Time HH Agency Contacted: [phone]     Social Determinants of Health (SDOH) Interventions    Readmission Risk Interventions No flowsheet data found.

## 2019-12-05 LAB — SYNOVIAL CELL COUNT + DIFF, W/ CRYSTALS
Crystals, Fluid: NONE SEEN
Eosinophils-Synovial: 0 % (ref 0–1)
Lymphocytes-Synovial Fld: 17 % (ref 0–20)
Monocyte-Macrophage-Synovial Fluid: 39 % — ABNORMAL LOW (ref 50–90)
Neutrophil, Synovial: 44 % — ABNORMAL HIGH (ref 0–25)
WBC, Synovial: 1350 /mm3 — ABNORMAL HIGH (ref 0–200)

## 2019-12-05 LAB — CULTURE, BLOOD (ROUTINE X 2)
Culture: NO GROWTH
Culture: NO GROWTH
Special Requests: ADEQUATE
Special Requests: ADEQUATE

## 2019-12-05 MED ORDER — METOCLOPRAMIDE HCL 5 MG/ML IJ SOLN
10.0000 mg | Freq: Once | INTRAMUSCULAR | Status: AC
  Administered 2019-12-05: 10 mg via INTRAVENOUS
  Filled 2019-12-05: qty 2

## 2019-12-05 MED ORDER — METOPROLOL TARTRATE 25 MG PO TABS: 12.5000 mg | ORAL_TABLET | Freq: Two times a day (BID) | ORAL | 2 refills | Status: AC

## 2019-12-05 MED ORDER — HYDROCODONE-ACETAMINOPHEN 5-325 MG PO TABS
1.0000 | ORAL_TABLET | ORAL | 0 refills | Status: AC | PRN
Start: 2019-12-05 — End: ?

## 2019-12-05 NOTE — Discharge Summary (Signed)
Physician Discharge Summary  Colen Eltzroth DPO:[id number] DOB: [birth year] DOA: 11/30/2019  PCP: Patient, No Pcp Per  Admit date: 11/30/2019 Discharge date: 12/05/2019  Admitted From: Home Disposition: Home Recommendations for Outpatient Follow-up:  1. Follow up with PCP in 1-2 weeks 2. Please obtain BMP/CBC in one week   Home Health: Yes Equipment/Devices: None Discharge Condition stable CODE STATUS: Full code Diet recommendation cardiac Brief/Interim Summary: 29 year old male with past medical history of [facility],hypertension who presented with a left foot pain x1 day which has gradually been worsening. Unable to bear weight.  Patient statedthat he was in his usual state of health until yesterday when he was leaving the [employer] store and had sudden onset of left ankle pain and inability to bear weight. Admittedto fevers up to 103 over the past 48 hours which have been declining and noted nausea, vomiting and a cough to the ED doc. Admittedto having STIs in the past including syphilis and was worked up for gonorrhea in the past but not sure if he was potitive or treated. Also admittedto some discomfort of his right elbow, wrist and right digits as well as right ankle pain. Deniedany trauma to his ankle. No noted erythema or warmth. States that he has been dating a new partner for the past 2 months but also had a sexual partner before that but does not believe that either of them have any symptoms. Admits to intermittent left and right-sided chest pain. He was unaware of any of the rashes on his feet noted during my exam. Denies any penile discharge or urinary complaints. He denies any known personal or family history of any rheumatologic disorders.  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  Seen by ID patient on ceftriaxone 2 g IV daily for 14 days.  PICC line placed.  MRI left ankle concerning for septic arthritis and tenosynovitis. Consulted IR and Dr. Lorin Mercy.  Dr. Lorin Mercy aspirated the left ankle aspirate did not show any signs of organisms or Crystals.  Leukocytosis resolved. DME ordered walker, 3 in 1 and wheelchair.  #2 history of essential hypertension continue Norvasc.  Added metoprolol during the hospital stay continue.  #3 history of HIV/syphilis follows at [place].    Estimated body mass index is 49.79 kg/m as calculated from the following:   Height as of this encounter: _0  (1.778 m).   Weight as of this encounter: 157.4 kg.  Discharge Instructions  Discharge Instructions    [facility] pharmacist to adjust dose for Vancomycin, Aminoglycosides and other anti-infective therapies as requested by physician.   Complete by: As directed    [facility] to provide Cath Flo 74m   Complete by: As directed    Administer for PICC line occlusion and as ordered by physician for other access device issues.   Anaphylaxis Kit: Provided to treat any anaphylactic reaction to the medication being provided to the patient if First Dose or when requested by physician   Complete by: As directed    Epinephrine 127mml vial / amp: Administer 0.39m17m0.39ml72mubcutaneously once for moderate to severe anaphylaxis, nurse to call physician and pharmacy  when reaction occurs and call 911 if needed for immediate care   Diphenhydramine 70m/ml IV vial: Administer 25-551mIV/IM PRN for first dose reaction, rash, itching, mild reaction, nurse to call physician and pharmacy when reaction occurs   Sodium Chloride 0.9% NS 50028mV: Administer if needed for  hypovolemic blood pressure drop or as ordered by physician after call to physician with anaphylactic reaction   Change dressing on IV access line weekly and PRN   Complete by: As directed    Diet - low sodium heart healthy   Complete by: As directed    Diet - low sodium heart healthy   Complete by: As directed    Flush IV access with Sodium Chloride 0.9% and Heparin 10 units/ml or 100 units/ml   Complete by: As directed    Home infusion instructions - [facility]   Complete by: As directed    Instructions: Flush IV access with Sodium Chloride 0.9% and Heparin 10units/ml or 100units/ml   Change dressing on IV access line: Weekly and PRN   Instructions Cath Flo 2mg3mdminister for PICC Line occlusion and as ordered by physician for other access device   [facility] pharmacist to adjust dose for: Vancomycin, Aminoglycosides and other anti-infective therapies as requested by physician   Increase activity slowly   Complete by: As directed    Increase activity slowly   Complete by: As directed    Method of administration may be changed at the discretion of home infusion pharmacist based upon assessment of the patient and/or caregiver's ability to self-administer the medication ordered   Complete by: As directed    No wound care   Complete by: As directed    No wound care   Complete by: As directed      Allergies as of 12/05/2019      Reactions   Fish Allergy Nausea And Vomiting   Shellfish Allergy Hives, Nausea And Vomiting      Medication List    STOP taking these medications   Fish Oil 1000 MG Caps   Oxycodone HCl 10 MG Tabs   polyethylene glycol 17 g packet Commonly known as: MiraLax     TAKE these medications   acetaminophen 500 MG tablet Commonly known as: TYLENOL Take 500 mg by mouth every 6 (six) hours as needed.   amLODipine 10 MG tablet Commonly known as: NORVASC Take 10 mg by mouth daily.   aspirin EC 81 MG tablet Take 81 mg by mouth  daily.   Biktarvy 50-200-25 MG Tabs tablet Generic drug: bictegravir-emtricitabine-tenofovir AF Take 1 tablet by mouth daily.   cefTRIAXone  IVPB Commonly known as: ROCEPHIN Inject 2 g into the vein daily for 10 days. Indication:  Disseminated gonorrhea First Dose: Yes Last Day of Therapy:  12/13/2019 Labs - Once weekly:  CBC/D and BMP, Labs - Every other week:  ESR and CRP Method of administration: IV Push Method of administration may be changed at the discretion of home infusion pharmacist based upon assessment of the patient and/or caregiver's ability to self-administer the medication ordered.   cholecalciferol 1000 units tablet Commonly known as: VITAMIN D Take 1,000 Units by mouth daily.   GARLIC PO Take 1 capsule by mouth daily.   HYDROcodone-acetaminophen 5-325 MG tablet Commonly known as: NORCO/VICODIN Take 1-2 tablets by mouth every 4 (four) hours as needed for moderate pain.   ketoconazole 2 % cream Commonly known as: NIZORAL Apply 1 application topically daily. Apply to feet daily  metoprolol tartrate 25 MG tablet Commonly known as: LOPRESSOR Take 0.5 tablets (12.5 mg total) by mouth 2 (two) times daily.   Mucinex 600 MG 12 hr tablet Generic drug: guaiFENesin Take 600 mg by mouth daily.   MULTIVITAMIN ADULT PO Take 1 tablet by mouth daily.   omeprazole 40 MG capsule Commonly known as: PRILOSEC Take 40 mg by mouth daily.   ondansetron 8 MG tablet Commonly known as: ZOFRAN Take 8 mg by mouth every 8 (eight) hours as needed for nausea or vomiting.            Durable Medical Equipment  (From admission, onward)         Start     Ordered   12/04/19 1027  DME 3-in-1  Once        12/04/19 1028   12/04/19 1027  DME standard manual wheelchair with seat cushion  Once       Comments: Patient suffers from left ankle septic arthritis which impairs their ability to perform daily activities like grooming combing brushing showering in the home.  A walker  will not resolve issue with performing activities of daily living. A wheelchair will allow patient to safely perform daily activities. Patient can safely propel the wheelchair in the home or has a caregiver who can provide assistance. Length of need 99 days. Accessories: elevating leg rests (ELRs), wheel locks, extensions and anti-tippers.   12/04/19 1028   12/04/19 1026  DME Walker  Once       Question Answer Comment  Walker: With [employer] Wheels   Patient needs a walker to treat with the following condition Unsteady gait      12/04/19 1028   12/04/19 1024  For home use only DME Walker  Once       Question:  Patient needs a walker to treat with the following condition  Answer:  Unsteady   12/04/19 1024           Discharge Care Instructions  (From admission, onward)         Start     Ordered   12/04/19 0000  Change dressing on IV access line weekly and PRN  (Home infusion instructions - [facility] )        12/04/19 1025          Allergies  Allergen Reactions  . Fish Allergy Nausea And Vomiting  . Shellfish Allergy Hives and Nausea And Vomiting    Consultations: Infectious disease and rheumatology.  Procedures/Studies: MR ANKLE LEFT W WO CONTRAST  Result Date: 12/03/2019 CLINICAL DATA:  Left ankle pain.  Disseminated gonococcal infection. EXAM: MRI OF THE LEFT ANKLE WITHOUT AND WITH CONTRAST TECHNIQUE: Multiplanar, multisequence MR imaging of the ankle was performed before and after the administration of intravenous contrast. CONTRAST:  63m GADAVIST GADOBUTROL 1 MMOL/ML IV SOLN COMPARISON:  Left foot x-rays dated November 30, 2019. FINDINGS: TENDONS Peroneal: Peroneal longus tendon intact. Peroneal brevis intact. Posteromedial: Posterior tibial tendon intact. Flexor hallucis longus tendon intact with prominent fluid in the tendon sheath extending from the level of the tibiotalar joint to the master knot of HMallie Mussel There is associated synovial enhancement. Flexor  digitorum longus tendon intact. Anterior: Tibialis anterior tendon intact. Extensor hallucis longus tendon intact Extensor digitorum longus tendon intact. Achilles:  Intact. Plantar Fascia: Intact. LIGAMENTS Lateral: Anterior talofibular ligament intact. Calcaneofibular ligament intact. Posterior talofibular ligament intact. Anterior and posterior tibiofibular ligaments intact. Medial: Deltoid ligament intact. Spring ligament intact. CARTILAGE Ankle Joint: Moderate joint  effusion with synovial enhancement. Normal ankle mortise. No chondral defect. Subtalar Joints/Sinus Tarsi: Normal subtalar joints. Moderate posterior subtalar joint effusion with synovial enhancement. Normal sinus tarsi. Bones: No marrow signal abnormality. No fracture or dislocation. Os trigonum. Soft Tissue: Mild diffuse soft tissue swelling. No soft tissue mass or fluid collection. IMPRESSION: IMPRESSION 1. Moderate tibiotalar and posterior subtalar joint effusions with synovial enhancement, concerning for septic arthritis. 2. Infectious flexor hallucis longus tenosynovitis extending from the level of the tibiotalar joint to the master knot of Mallie Mussel. 3. No osteomyelitis or abscess. Electronically Signed   By: Titus Dubin M.D.   On: 12/03/2019 11:37   DG Foot Complete Left  Result Date: 11/30/2019 CLINICAL DATA:  Left foot pain. EXAM: LEFT FOOT - COMPLETE 3+ VIEW COMPARISON:  None. FINDINGS: There is no evidence of fracture or dislocation. There is no evidence of arthropathy or other focal bone abnormality. Soft tissues are unremarkable. IMPRESSION: Negative. Electronically Signed   By: Virgina Norfolk M.D.   On: 11/30/2019 03:02   [place] EKG SITE RITE  Result Date: 12/03/2019 If Site Rite image not attached, placement could not be confirmed due to current cardiac rhythm.  [place] EKG SITE RITE  Result Date: 12/02/2019 If Site Rite image not attached, placement could not be confirmed due to current cardiac rhythm.   (Echo, Carotid,  EGD, Colonoscopy, ERCP)    Subjective: Patient resting in bed feels pain is better  Discharge Exam: Vitals:   12/05/19 0543 12/05/19 1406  BP: (!) 151/78 (!) 155/103  Pulse: 67 76  Resp: 17 18  Temp: (!) 97.4 F (36.3 C) 98.8 F (37.1 C)  SpO2: 100% 100%   Vitals:   12/04/19 1349 12/04/19 2055 12/05/19 0543 12/05/19 1406  BP: 138/79 (!) 166/96 (!) 151/78 (!) 155/103  Pulse: 70 82 67 76  Resp: _0 Temp: 97.8 F (36.6 C) 98.9 F (37.2 C) (!) 97.4 F (36.3 C) 98.8 F (37.1 C)  TempSrc: Oral   Oral  SpO2: 99% 99% 100% 100%  Weight:      Height:        General: Pt is alert, awake, not in acute distress Cardiovascular: RRR, S1/S2 +, no rubs, no gallops Respiratory: CTA bilaterally, no wheezing, no rhonchi Abdominal: Soft, NT, ND, bowel sounds + Extremities: Left ankle edema improved range of motion improved    The results of significant diagnostics from this hospitalization (including imaging, microbiology, ancillary and laboratory) are listed below for reference.     Microbiology: Recent Results (from the past 240 hour(s))  Respiratory Panel by RT PCR (Flu A&B, Covid) -     Status: None   Collection Time: 11/30/19  4:36 AM   Specimen: Nasopharyngeal  Result Value Ref Range Status   SARS Coronavirus 2 by RT PCR NEGATIVE NEGATIVE Final    Comment: (NOTE) SARS-CoV-2 target nucleic acids are NOT DETECTED.  The SARS-CoV-2 RNA is generally detectable in upper respiratoy specimens during the acute phase of infection. The lowest concentration of SARS-CoV-2 viral copies this assay can detect is 131 copies/mL. A negative result does not preclude SARS-Cov-2 infection and should not be used as the sole basis for treatment or other patient management decisions. A negative result may occur with  improper specimen collection/handling, submission of specimen other than nasopharyngeal swab, presence of viral mutation(s) within the areas targeted by this assay, and  inadequate number of viral copies (<131 copies/mL). A negative result must be combined with clinical observations, patient history, and epidemiological  information. The expected result is Negative.  Fact Sheet for Patients:  PinkCheek.be  Fact Sheet for Healthcare Providers:  GravelBags.it  This test is no t yet approved or cleared by the [place] FDA and  has been authorized for detection and/or diagnosis of SARS-CoV-2 by FDA under an Emergency Use Authorization (EUA). This EUA will remain  in effect (meaning this test can be used) for the duration of the COVID-19 declaration under Section 564(b)(1) of the Act, 21 U.S.C. section 360bbb-3(b)(1), unless the authorization is terminated or revoked sooner.     Influenza A by PCR NEGATIVE NEGATIVE Final   Influenza B by PCR NEGATIVE NEGATIVE Final    Comment: (NOTE) The Xpert Xpress SARS-CoV-2/FLU/RSV assay is intended as an aid in  the diagnosis of influenza from Nasopharyngeal swab specimens and  should not be used as a sole basis for treatment. Nasal washings and  aspirates are unacceptable for Xpert Xpress SARS-CoV-2/FLU/RSV  testing.  Fact Sheet for Patients: PinkCheek.be  Fact Sheet for Healthcare Providers: GravelBags.it  This test is not yet approved or cleared by the [place] FDA and  has been authorized for detection and/or diagnosis of SARS-CoV-2 by  FDA under an Emergency Use Authorization (EUA). This EUA will remain  in effect (meaning this test can be used) for the duration of the  Covid-19 declaration under Section 564(b)(1) of the Act, 21  U.S.C. section 360bbb-3(b)(1), unless the authorization is  terminated or revoked. Performed at [facility], [place] [address], [place], [place] [address]   Culture, blood (routine x 2)     Status: None   Collection Time: 11/30/19  9:40  AM   Specimen: BLOOD LEFT HAND  Result Value Ref Range Status   Specimen Description   Final    BLOOD LEFT HAND Performed at [facility] [address] [place], [place] [address]    Special Requests   Final    BOTTLES DRAWN AEROBIC ONLY Blood Culture adequate volume Performed at [facility] [address], [place], [place] [address]    Culture   Final    NO GROWTH 5 DAYS Performed at [facility], [place] [address] [place], [place] [address]    Report Status 12/05/2019 FINAL  Final  Culture, blood (routine x 2)     Status: None   Collection Time: 11/30/19 10:12 AM   Specimen: BLOOD RIGHT HAND  Result Value Ref Range Status   Specimen Description   Final    BLOOD RIGHT HAND Performed at [facility] [address] [place], [place] [address]    Special Requests   Final    BOTTLES DRAWN AEROBIC AND ANAEROBIC Blood Culture adequate volume Performed at [facility] [address], [place], [place] [address]    Culture   Final    NO GROWTH 5 DAYS Performed at [facility], [place] [address], [place], [place] [address]    Report Status 12/05/2019 FINAL  Final  Body fluid culture     Status: None (Preliminary result)   Collection Time: 12/04/19  6:35 PM   Specimen: Ankle; Body Fluid  Result Value Ref Range Status   Specimen Description   Final    ANKLE SYNOVIAL Performed at [facility] [address] [place], [place] [address]    Special Requests   Final    Immunocompromised Performed at [facility], [place] [address], [place], [place] [address]  Gram Stain   Final    FEW WBC PRESENT,BOTH PMN AND MONONUCLEAR NO ORGANISMS SEEN    Culture   Final    NO GROWTH < 12 HOURS Performed at [facility] [address] [place], [place] [address]    Report Status PENDING  Incomplete     Labs: BNP (last 3 results) No results for input(s): BNP  in the last 8760 hours. Basic Metabolic Panel: Recent Labs  Lab 11/30/19 0436 11/30/19 1010 12/01/19 0317 12/02/19 0349  NA 136  --  135 135  K 3.6  --  3.9 3.9  CL 98  --  101 97*  CO2 25  --  24 26  GLUCOSE 130*  --  106* 124*  BUN 12  --  10 11  CREATININE 1.17 1.00 1.05 1.03  CALCIUM 9.4  --  8.6* 9.0  MG  --   --   --  2.2   Liver Function Tests: Recent Labs  Lab 11/30/19 1010  AST 17  ALT 20  ALKPHOS 64  BILITOT 1.3*  PROT 7.4  ALBUMIN 3.7   No results for input(s): LIPASE, AMYLASE in the last 168 hours. No results for input(s): AMMONIA in the last 168 hours. CBC: Recent Labs  Lab 11/30/19 0436 11/30/19 1010 12/01/19 0317 12/02/19 0349  WBC 23.1* 24.2* 19.8* 15.8*  NEUTROABS 18.8*  --   --  10.6*  HGB 15.2 14.1 14.1 14.1  HCT 47.7 44.5 44.5 44.9  MCV 82.5 82.1 83.6 83.9  PLT 276 244 265 264   Cardiac Enzymes: No results for input(s): CKTOTAL, CKMB, CKMBINDEX, TROPONINI in the last 168 hours. BNP: Invalid input(s): POCBNP CBG: No results for input(s): GLUCAP in the last 168 hours. D-Dimer No results for input(s): DDIMER in the last 72 hours. Hgb A1c No results for input(s): HGBA1C in the last 72 hours. Lipid Profile No results for input(s): CHOL, HDL, LDLCALC, TRIG, CHOLHDL, LDLDIRECT in the last 72 hours. Thyroid function studies No results for input(s): TSH, T4TOTAL, T3FREE, THYROIDAB in the last 72 hours.  Invalid input(s): FREET3 Anemia work up No results for input(s): VITAMINB12, FOLATE, FERRITIN, TIBC, IRON, RETICCTPCT in the last 72 hours. Urinalysis    Component Value Date/Time   COLORURINE YELLOW 11/30/2019 0436   APPEARANCEUR CLEAR 11/30/2019 0436   LABSPEC 1.028 11/30/2019 0436   PHURINE 6.0 11/30/2019 0436   GLUCOSEU NEGATIVE 11/30/2019 0436   HGBUR NEGATIVE 11/30/2019 0436   BILIRUBINUR NEGATIVE 11/30/2019 0436   KETONESUR 5 (A) 11/30/2019 0436   PROTEINUR 30 (A) 11/30/2019 0436   UROBILINOGEN 0.2 10/20/2013 1700   NITRITE  NEGATIVE 11/30/2019 0436   LEUKOCYTESUR NEGATIVE 11/30/2019 0436   Sepsis Labs Invalid input(s): PROCALCITONIN,  WBC,  LACTICIDVEN Microbiology Recent Results (from the past 240 hour(s))  Respiratory Panel by RT PCR (Flu A&B, Covid) -     Status: None   Collection Time: 11/30/19  4:36 AM   Specimen: Nasopharyngeal  Result Value Ref Range Status   SARS Coronavirus 2 by RT PCR NEGATIVE NEGATIVE Final    Comment: (NOTE) SARS-CoV-2 target nucleic acids are NOT DETECTED.  The SARS-CoV-2 RNA is generally detectable in upper respiratoy specimens during the acute phase of infection. The lowest concentration of SARS-CoV-2 viral copies this assay can detect is 131 copies/mL. A negative result does not preclude SARS-Cov-2 infection and should not be used as the sole basis for treatment or other patient management decisions. A negative result may occur with  improper specimen collection/handling,  submission of specimen other than nasopharyngeal swab, presence of viral mutation(s) within the areas targeted by this assay, and inadequate number of viral copies (<131 copies/mL). A negative result must be combined with clinical observations, patient history, and epidemiological information. The expected result is Negative.  Fact Sheet for Patients:  PinkCheek.be  Fact Sheet for Healthcare Providers:  GravelBags.it  This test is no t yet approved or cleared by the [place] FDA and  has been authorized for detection and/or diagnosis of SARS-CoV-2 by FDA under an Emergency Use Authorization (EUA). This EUA will remain  in effect (meaning this test can be used) for the duration of the COVID-19 declaration under Section 564(b)(1) of the Act, 21 U.S.C. section 360bbb-3(b)(1), unless the authorization is terminated or revoked sooner.     Influenza A by PCR NEGATIVE NEGATIVE Final   Influenza B by PCR NEGATIVE NEGATIVE Final     Comment: (NOTE) The Xpert Xpress SARS-CoV-2/FLU/RSV assay is intended as an aid in  the diagnosis of influenza from Nasopharyngeal swab specimens and  should not be used as a sole basis for treatment. Nasal washings and  aspirates are unacceptable for Xpert Xpress SARS-CoV-2/FLU/RSV  testing.  Fact Sheet for Patients: PinkCheek.be  Fact Sheet for Healthcare Providers: GravelBags.it  This test is not yet approved or cleared by the [place] FDA and  has been authorized for detection and/or diagnosis of SARS-CoV-2 by  FDA under an Emergency Use Authorization (EUA). This EUA will remain  in effect (meaning this test can be used) for the duration of the  Covid-19 declaration under Section 564(b)(1) of the Act, 21  U.S.C. section 360bbb-3(b)(1), unless the authorization is  terminated or revoked. Performed at [facility], [place] [address] [place], [place] [address]   Culture, blood (routine x 2)     Status: None   Collection Time: 11/30/19  9:40 AM   Specimen: BLOOD LEFT HAND  Result Value Ref Range Status   Specimen Description   Final    BLOOD LEFT HAND Performed at [facility] [address] [place], [place] [address]    Special Requests   Final    BOTTLES DRAWN AEROBIC ONLY Blood Culture adequate volume Performed at [facility] [address], [place], [place] [address]    Culture   Final    NO GROWTH 5 DAYS Performed at [facility], [place] [address] [place], [place] [address]    Report Status 12/05/2019 FINAL  Final  Culture, blood (routine x 2)     Status: None   Collection Time: 11/30/19 10:12 AM   Specimen: BLOOD RIGHT HAND  Result Value Ref Range Status   Specimen Description   Final    BLOOD RIGHT HAND Performed at [facility] [address] [place], [place] [address]    Special Requests   Final    BOTTLES DRAWN  AEROBIC AND ANAEROBIC Blood Culture adequate volume Performed at [facility] [address], [place], [place] [address]    Culture   Final    NO GROWTH 5 DAYS Performed at [facility], [place] [address], [place], [place] [address]    Report Status 12/05/2019 FINAL  Final  Body fluid culture     Status: None (Preliminary result)   Collection Time: 12/04/19  6:35 PM   Specimen: Ankle; Body Fluid  Result Value Ref Range Status   Specimen Description   Final    ANKLE  SYNOVIAL Performed at [facility], [place] [address] [place], [place] [address]    Special Requests   Final    Immunocompromised Performed at [facility], [place] [address] [place], [place] [address]    Gram Stain   Final    FEW WBC PRESENT,BOTH PMN AND MONONUCLEAR NO ORGANISMS SEEN    Culture   Final    NO GROWTH < 12 HOURS Performed at [facility], [place] [address], [place], [place] [address]    Report Status PENDING  Incomplete     Time coordinating discharge:  39 minutes  SIGNED:   Georgette Shell, MD  [facility] 12/05/2019, 2:29 PM

## 2019-12-05 NOTE — Plan of Care (Signed)
  Problem: Education: Goal: Knowledge of General Education information will improve Description: Including pain rating scale, medication(s)/side effects and non-pharmacologic comfort measures Outcome: Progressing   Problem: Activity: Goal: Risk for activity intolerance will decrease Outcome: Progressing   Problem: Pain Managment: Goal: General experience of comfort will improve Outcome: Progressing   

## 2019-12-05 NOTE — Progress Notes (Signed)
PROGRESS NOTE    Jimmy Fields  XVQ:[id number] DOB: 03-15-1990 DOA: 11/30/2019 PCP: Patient, No Pcp Per   Brief Narrative: HPI by Dr. Dairl Ponder This is a 29 year old male with past medical history of HIV on [facility] at [facility] s/p treatment,hypertension who presented with a left foot pain x1 day which has gradually been worsening. Unable to bear weight.  Patient stated that he was in his usual state of health until yesterday when he was leaving the [employer] [place] and had sudden onset of left ankle pain and inability to bear weight. Admitted to fevers up to 103 over the past 48 hours which have been declining and noted nausea, vomiting and a cough to the ED doc. Admitted to having STIs in the past including syphilis and was worked up for gonorrhea in the past but not sure if he was potitive or treated. Also admitted to some discomfort of his right elbow, wrist and right digits as well as right ankle pain. Denied any trauma to his ankle. No noted erythema or warmth. States that he has been dating a new partner for the past 2 months but also had a sexual partner before that but does not believe that either of them have any symptoms. Admits to intermittent left and right-sided chest pain. He was unaware of any of the rashes on his feet noted during my exam. Denies any penile discharge or urinary complaints. He denies any known personal or family history of any rheumatologic disorders.  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  Seen by ID patient on ceftriaxone 2 g IV daily for 14 days.  PICC line placed.  MRI left ankle concerning for septic  arthritis and tenosynovitis. Consulted IR and Dr. Ophelia Charter. DME ordered  #2 history of essential hypertension continue Norvasc.  #3 history of HIV/syphilis follows at [facility].  On [place].    Estimated body mass index is 49.79 kg/m as calculated from the following:   Height as of this encounter: 5\' 10"  (1.778 m).   Weight as of this encounter: 157.4 kg.  DVT prophylaxis: Lovenox  code Status: Full code Family Communication: None at bedside  disposition Plan:  Status is: Inpatient   Dispo: The patient is from: Home              Anticipated d/c is to: Home              Anticipated d/c date is: 2 days              Patient currently is medically stable however insurance has not authorized for home IV antibiotics.   Consultants: Infectious disease  Procedures: None Antimicrobials Rocephin  Subjective: Patient reports his pain is better swelling is better Objective: Vitals:   12/04/19 1349 12/04/19 2055 12/05/19 0543 12/05/19 1406  BP: 138/79 (!) 166/96 (!) 151/78 (!) 155/103  Pulse: 70 82 67 76  Resp: 16 20 17 18   Temp: 97.8 F (36.6 C) 98.9 F (37.2 C) (!) 97.4 F (36.3 C) 98.8 F (37.1 C)  TempSrc: Oral   Oral  SpO2: 99% 99% 100% 100%  Weight:      Height:  Intake/Output Summary (Last 24 hours) at 12/05/2019 1510 Last data filed at 12/05/2019 1210 Gross per 24 hour  Intake 820 ml  Output --  Net 820 ml   Filed Weights   11/30/19 2207  Weight: (!) 157.4 kg    Examination:  General exam: Appears calm and comfortable  Respiratory system: Clear to auscultation. Respiratory effort normal. Cardiovascular system: S1 & S2 heard, RRR. No JVD, murmurs, rubs, gallops or clicks. No pedal edema. Gastrointestinal system: Abdomen is nondistended, soft and nontender. No organomegaly or masses felt. Normal bowel sounds heard. Central nervous system: Alert and oriented. No focal neurological deficits. Extremities: Swollen left ankle Skin: Multiple erythematous  lesions in the lower extremities noted Psychiatry: Judgement and insight appear normal. Mood & affect appropriate.     Data Reviewed: I have personally reviewed following labs and imaging studies  CBC: Recent Labs  Lab 11/30/19 0436 11/30/19 1010 12/01/19 0317 12/02/19 0349  WBC 23.1* 24.2* 19.8* 15.8*  NEUTROABS 18.8*  --   --  10.6*  HGB 15.2 14.1 14.1 14.1  HCT 47.7 44.5 44.5 44.9  MCV 82.5 82.1 83.6 83.9  PLT 276 244 265 264   Basic Metabolic Panel: Recent Labs  Lab 11/30/19 0436 11/30/19 1010 12/01/19 0317 12/02/19 0349  NA 136  --  135 135  K 3.6  --  3.9 3.9  CL 98  --  101 97*  CO2 25  --  24 26  GLUCOSE 130*  --  106* 124*  BUN 12  --  10 11  CREATININE 1.17 1.00 1.05 1.03  CALCIUM 9.4  --  8.6* 9.0  MG  --   --   --  2.2   GFR: Estimated Creatinine Clearance: 159.9 mL/min (by C-G formula based on SCr of 1.03 mg/dL). Liver Function Tests: Recent Labs  Lab 11/30/19 1010  AST 17  ALT 20  ALKPHOS 64  BILITOT 1.3*  PROT 7.4  ALBUMIN 3.7   No results for input(s): LIPASE, AMYLASE in the last 168 hours. No results for input(s): AMMONIA in the last 168 hours. Coagulation Profile: No results for input(s): INR, PROTIME in the last 168 hours. Cardiac Enzymes: No results for input(s): CKTOTAL, CKMB, CKMBINDEX, TROPONINI in the last 168 hours. BNP (last 3 results) No results for input(s): PROBNP in the last 8760 hours. HbA1C: No results for input(s): HGBA1C in the last 72 hours. CBG: No results for input(s): GLUCAP in the last 168 hours. Lipid Profile: No results for input(s): CHOL, HDL, LDLCALC, TRIG, CHOLHDL, LDLDIRECT in the last 72 hours. Thyroid Function Tests: No results for input(s): TSH, T4TOTAL, FREET4, T3FREE, THYROIDAB in the last 72 hours. Anemia Panel: No results for input(s): VITAMINB12, FOLATE, FERRITIN, TIBC, IRON, RETICCTPCT in the last 72 hours. Sepsis Labs: Recent Labs  Lab 11/30/19 2671  LATICACIDVEN 0.9    Recent Results  (from the past 240 hour(s))  Respiratory Panel by RT PCR (Flu A&B, Covid) -     Status: None   Collection Time: 11/30/19  4:36 AM   Specimen: Nasopharyngeal  Result Value Ref Range Status   SARS Coronavirus 2 by RT PCR NEGATIVE NEGATIVE Final    Comment: (NOTE) SARS-CoV-2 target nucleic acids are NOT DETECTED.  The SARS-CoV-2 RNA is generally detectable in upper respiratoy specimens during the acute phase of infection. The lowest concentration of SARS-CoV-2 viral copies this assay can detect is 131 copies/mL. A negative result does not preclude SARS-Cov-2 infection and should not be used as the sole basis for  treatment or other patient management decisions. A negative result may occur with  improper specimen collection/handling, submission of specimen other than nasopharyngeal swab, presence of viral mutation(s) within the areas targeted by this assay, and inadequate number of viral copies (<131 copies/mL). A negative result must be combined with clinical observations, patient history, and epidemiological information. The expected result is Negative.  Fact Sheet for Patients:  https://www.moore.com/  Fact Sheet for Healthcare Providers:  https://www.young.biz/  This test is no t yet approved or cleared by the [place] FDA and  has been authorized for detection and/or diagnosis of SARS-CoV-2 by FDA under an Emergency Use Authorization (EUA). This EUA will remain  in effect (meaning this test can be used) for the duration of the COVID-19 declaration under Section 564(b)(1) of the Act, 21 U.S.C. section 360bbb-3(b)(1), unless the authorization is terminated or revoked sooner.     Influenza A by PCR NEGATIVE NEGATIVE Final   Influenza B by PCR NEGATIVE NEGATIVE Final    Comment: (NOTE) The Xpert Xpress SARS-CoV-2/FLU/RSV assay is intended as an aid in  the diagnosis of influenza from Nasopharyngeal swab specimens and  should not be used  as a sole basis for treatment. Nasal washings and  aspirates are unacceptable for Xpert Xpress SARS-CoV-2/FLU/RSV  testing.  Fact Sheet for Patients: https://www.moore.com/  Fact Sheet for Healthcare Providers: https://www.young.biz/  This test is not yet approved or cleared by the [place] FDA and  has been authorized for detection and/or diagnosis of SARS-CoV-2 by  FDA under an Emergency Use Authorization (EUA). This EUA will remain  in effect (meaning this test can be used) for the duration of the  Covid-19 declaration under Section 564(b)(1) of the Act, 21  U.S.C. section 360bbb-3(b)(1), unless the authorization is  terminated or revoked. Performed at [facility], [address] [place], [place] [address]   Culture, blood (routine x 2)     Status: None   Collection Time: 11/30/19  9:40 AM   Specimen: BLOOD LEFT HAND  Result Value Ref Range Status   Specimen Description   Final    BLOOD LEFT HAND Performed at [facility], [address] [place], [place] [address]    Special Requests   Final    BOTTLES DRAWN AEROBIC ONLY Blood Culture adequate volume Performed at [facility], [address] [place], [place] [address]    Culture   Final    NO GROWTH 5 DAYS Performed at [facility], [address], [place], [place] [address]    Report Status 12/05/2019 FINAL  Final  Culture, blood (routine x 2)     Status: None   Collection Time: 11/30/19 10:12 AM   Specimen: BLOOD RIGHT HAND  Result Value Ref Range Status   Specimen Description   Final    BLOOD RIGHT HAND Performed at [facility], [address], [place], [place] [address]    Special Requests   Final    BOTTLES DRAWN AEROBIC AND ANAEROBIC Blood Culture adequate volume Performed at [facility], [address] [place], [place] [address]    Culture   Final    NO  GROWTH 5 DAYS Performed at [facility], [address] [place], [place] [address]    Report Status 12/05/2019 FINAL  Final  Body fluid culture     Status: None (Preliminary result)   Collection Time: 12/04/19  6:35 PM   Specimen: Ankle; Body Fluid  Result Value Ref Range Status   Specimen Description   Final    ANKLE SYNOVIAL Performed at [facility], [address] [place], [place] [address]    Special Requests   Final    Immunocompromised Performed at [facility], [address] [place], [place] [address]    Gram Stain   Final    FEW WBC PRESENT,BOTH PMN AND MONONUCLEAR NO ORGANISMS SEEN    Culture   Final    NO GROWTH < 12 HOURS Performed at [facility], [address] [place], [place] [address]    Report Status PENDING  Incomplete         Radiology Studies: No results found.      Scheduled Meds: . amLODipine  10 mg Oral Daily  . aspirin EC  81 mg Oral Daily  . bictegravir-emtricitabine-tenofovir AF  1 tablet Oral Daily  . Chlorhexidine Gluconate Cloth  6 each Topical Daily  . dextromethorphan-guaiFENesin  1 tablet Oral BID  . enoxaparin (LOVENOX) injection  80 mg Subcutaneous Q24H  . metoprolol tartrate  12.5 mg Oral BID  . pantoprazole  40 mg Oral Daily  . sodium chloride flush  10-40 mL Intracatheter Q12H   Continuous Infusions: . sodium chloride Stopped (12/03/19 1108)  . cefTRIAXone (ROCEPHIN)  IV 2 g (12/05/19 1008)     LOS: 4 days   Alwyn RenElizabeth G Suriah Peragine, MD  12/05/2019, 3:10 PM

## 2019-12-05 NOTE — Progress Notes (Signed)
No crystals on ankle aspirate. No organsism. WBC trending down 24,29,15K. On ABX. Will sign off.

## 2019-12-06 MED ORDER — LIP MEDEX EX OINT
TOPICAL_OINTMENT | CUTANEOUS | Status: DC | PRN
  Filled 2019-12-06: qty 7

## 2019-12-06 NOTE — Plan of Care (Signed)
  Problem: Clinical Measurements: Goal: Ability to maintain clinical measurements within normal limits will improve Outcome: Progressing   Problem: Clinical Measurements: Goal: Cardiovascular complication will be avoided Outcome: Progressing   Problem: Nutrition: Goal: Adequate nutrition will be maintained Outcome: Progressing   Problem: Coping: Goal: Level of anxiety will decrease Outcome: Progressing   Problem: Elimination: Goal: Will not experience complications related to bowel motility Outcome: Progressing

## 2019-12-06 NOTE — Progress Notes (Signed)
PROGRESS NOTE    Jimmy Fields  WCH:[id number] DOB: [birth year] DOA: 11/30/2019 PCP: Patient, No Pcp Per   Brief Narrative: HPI by Dr. Dairl Ponder This is a 29 year old male with past medical history of HIV on [facility]s at [facility] s/p treatment,hypertension who presented with a left foot pain x1 day which has gradually been worsening. Unable to bear weight.  Patient stated that he was in his usual state of health until yesterday when he was leaving the [employer] and had sudden onset of left ankle pain and inability to bear weight. Admitted to fevers up to 103 over the past 48 hours which have been declining and noted nausea, vomiting and a cough to the ED doc. Admitted to having STIs in the past including syphilis and was worked up for gonorrhea in the past but not sure if he was potitive or treated. Also admitted to some discomfort of his right elbow, wrist and right digits as well as right ankle pain. Denied any trauma to his ankle. No noted erythema or warmth. States that he has been dating a new partner for the past 2 months but also had a sexual partner before that but does not believe that either of them have any symptoms. Admits to intermittent left and right-sided chest pain. He was unaware of any of the rashes on his feet noted during my exam. Denies any penile discharge or urinary complaints. He denies any known personal or family history of any rheumatologic disorders.  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  Seen by ID patient on ceftriaxone 2 g IV daily for 14 days.  PICC line placed.  MRI left ankle concerning for septic  arthritis and tenosynovitis. Consulted IR and Dr. Ophelia Charter. DME ordered Insurance has not authorized for outpatient antibiotics yet therefore patient remains in the hospital at least till tomorrow.  #2 history of essential hypertension continue Norvasc.  #3 history of HIV/syphilis follows at [facility].  [place].    Estimated body mass index is 49.79 kg/m as calculated from the following:   Height as of this encounter: 5\' 10"  (1.778 m).   Weight as of this encounter: 157.4 kg.  DVT prophylaxis: Lovenox  code Status: Full code Family Communication: None at bedside  disposition Plan:  Status is: Inpatient   Dispo: The patient is from: Home              Anticipated d/c is to: Home              Anticipated d/c date is: 2 days              Patient currently is medically stable however insurance has not authorized for home IV antibiotics.   Consultants: Infectious disease  Procedures: None Antimicrobials Rocephin  Subjective: Patient resting in bed he reports left ankle pain is better Objective: Vitals:   12/05/19 1629 12/05/19 1654 12/05/19 2122 12/06/19 0435  BP: (!) 154/92 135/85 (!) 151/96 (!) 148/86  Pulse:  82 77 80  Resp:   16 17  Temp:   98 F (36.7 C) 98.6 F (37 C)  TempSrc:   Oral Oral  SpO2:   97%  Weight:      Height:        Intake/Output Summary (Last 24 hours) at 12/06/2019 1327 Last data filed at 12/06/2019 1321 Gross per 24 hour  Intake 1730 ml  Output --  Net 1730 ml   Filed Weights   11/30/19 2207  Weight: (!) 157.4 kg    Examination:  General exam: Appears calm and comfortable  Respiratory system: Clear to auscultation. Respiratory effort normal. Cardiovascular system: S1 & S2 heard, RRR. No JVD, murmurs, rubs, gallops or clicks. No pedal edema. Gastrointestinal system: Abdomen is nondistended, soft and nontender. No organomegaly or masses felt. Normal bowel sounds heard. Central nervous system: Alert and oriented. No focal  neurological deficits. Extremities: Swollen left ankle Skin: Multiple erythematous lesions in the lower extremities noted Psychiatry: Judgement and insight appear normal. Mood & affect appropriate.     Data Reviewed: I have personally reviewed following labs and imaging studies  CBC: Recent Labs  Lab 11/30/19 0436 11/30/19 1010 12/01/19 0317 12/02/19 0349  WBC 23.1* 24.2* 19.8* 15.8*  NEUTROABS 18.8*  --   --  10.6*  HGB 15.2 14.1 14.1 14.1  HCT 47.7 44.5 44.5 44.9  MCV 82.5 82.1 83.6 83.9  PLT 276 244 265 264   Basic Metabolic Panel: Recent Labs  Lab 11/30/19 0436 11/30/19 1010 12/01/19 0317 12/02/19 0349  NA 136  --  135 135  K 3.6  --  3.9 3.9  CL 98  --  101 97*  CO2 25  --  24 26  GLUCOSE 130*  --  106* 124*  BUN 12  --  10 11  CREATININE 1.17 1.00 1.05 1.03  CALCIUM 9.4  --  8.6* 9.0  MG  --   --   --  2.2   GFR: Estimated Creatinine Clearance: 159.9 mL/min (by C-G formula based on SCr of 1.03 mg/dL). Liver Function Tests: Recent Labs  Lab 11/30/19 1010  AST 17  ALT 20  ALKPHOS 64  BILITOT 1.3*  PROT 7.4  ALBUMIN 3.7   No results for input(s): LIPASE, AMYLASE in the last 168 hours. No results for input(s): AMMONIA in the last 168 hours. Coagulation Profile: No results for input(s): INR, PROTIME in the last 168 hours. Cardiac Enzymes: No results for input(s): CKTOTAL, CKMB, CKMBINDEX, TROPONINI in the last 168 hours. BNP (last 3 results) No results for input(s): PROBNP in the last 8760 hours. HbA1C: No results for input(s): HGBA1C in the last 72 hours. CBG: No results for input(s): GLUCAP in the last 168 hours. Lipid Profile: No results for input(s): CHOL, HDL, LDLCALC, TRIG, CHOLHDL, LDLDIRECT in the last 72 hours. Thyroid Function Tests: No results for input(s): TSH, T4TOTAL, FREET4, T3FREE, THYROIDAB in the last 72 hours. Anemia Panel: No results for input(s): VITAMINB12, FOLATE, FERRITIN, TIBC, IRON, RETICCTPCT in the last 72  hours. Sepsis Labs: Recent Labs  Lab 11/30/19 2878  LATICACIDVEN 0.9    Recent Results (from the past 240 hour(s))  Respiratory Panel by RT PCR (Flu A&B, Covid) -     Status: None   Collection Time: 11/30/19  4:36 AM   Specimen: Nasopharyngeal  Result Value Ref Range Status   SARS Coronavirus 2 by RT PCR NEGATIVE NEGATIVE Final    Comment: (NOTE) SARS-CoV-2 target nucleic acids are NOT DETECTED.  The SARS-CoV-2 RNA is generally detectable in upper respiratoy specimens during the acute phase of infection. The lowest concentration of SARS-CoV-2 viral copies this assay can detect is 131 copies/mL. A negative result does  not preclude SARS-Cov-2 infection and should not be used as the sole basis for treatment or other patient management decisions. A negative result may occur with  improper specimen collection/handling, submission of specimen other than nasopharyngeal swab, presence of viral mutation(s) within the areas targeted by this assay, and inadequate number of viral copies (<131 copies/mL). A negative result must be combined with clinical observations, patient history, and epidemiological information. The expected result is Negative.  Fact Sheet for Patients:  https://www.moore.com/  Fact Sheet for Healthcare Providers:  https://www.young.biz/  This test is no t yet approved or cleared by the [place] FDA and  has been authorized for detection and/or diagnosis of SARS-CoV-2 by FDA under an Emergency Use Authorization (EUA). This EUA will remain  in effect (meaning this test can be used) for the duration of the COVID-19 declaration under Section 564(b)(1) of the Act, 21 U.S.C. section 360bbb-3(b)(1), unless the authorization is terminated or revoked sooner.     Influenza A by PCR NEGATIVE NEGATIVE Final   Influenza B by PCR NEGATIVE NEGATIVE Final    Comment: (NOTE) The Xpert Xpress SARS-CoV-2/FLU/RSV assay is intended as an  aid in  the diagnosis of influenza from Nasopharyngeal swab specimens and  should not be used as a sole basis for treatment. Nasal washings and  aspirates are unacceptable for Xpert Xpress SARS-CoV-2/FLU/RSV  testing.  Fact Sheet for Patients: https://www.moore.com/  Fact Sheet for Healthcare Providers: https://www.young.biz/  This test is not yet approved or cleared by the [place] FDA and  has been authorized for detection and/or diagnosis of SARS-CoV-2 by  FDA under an Emergency Use Authorization (EUA). This EUA will remain  in effect (meaning this test can be used) for the duration of the  Covid-19 declaration under Section 564(b)(1) of the Act, 21  U.S.C. section 360bbb-3(b)(1), unless the authorization is  terminated or revoked. Performed at [facility], [address], [place], [place] [address]   Culture, blood (routine x 2)     Status: None   Collection Time: 11/30/19  9:40 AM   Specimen: BLOOD LEFT HAND  Result Value Ref Range Status   Specimen Description   Final    BLOOD LEFT HAND Performed at [facility], [address], [place], [place] [address]    Special Requests   Final    BOTTLES DRAWN AEROBIC ONLY Blood Culture adequate volume Performed at [facility], [address], [place], [place] [address]    Culture   Final    NO GROWTH 5 DAYS Performed at [facility], [address] [place], [place] [address]    Report Status 12/05/2019 FINAL  Final  Culture, blood (routine x 2)     Status: None   Collection Time: 11/30/19 10:12 AM   Specimen: BLOOD RIGHT HAND  Result Value Ref Range Status   Specimen Description   Final    BLOOD RIGHT HAND Performed at [facility], [address] [place], [place] [address]    Special Requests   Final    BOTTLES DRAWN AEROBIC AND ANAEROBIC Blood Culture adequate volume Performed at [facility], [address] [place], [place] [address]    Culture   Final    NO GROWTH 5 DAYS Performed at [facility], [address] [place], [place] [address]    Report Status 12/05/2019 FINAL  Final  Body fluid culture     Status: None (Preliminary result)  Collection Time: 12/04/19  6:35 PM   Specimen: Ankle; Body Fluid  Result Value Ref Range Status   Specimen Description   Final    ANKLE SYNOVIAL Performed at [facility], [address] [place], [place] [address]    Special Requests   Final    Immunocompromised Performed at [facility], [address] [place], [place] [address]    Gram Stain   Final    FEW WBC PRESENT,BOTH PMN AND MONONUCLEAR NO ORGANISMS SEEN    Culture   Final    NO GROWTH 2 DAYS Performed at [facility], [address] [place], [place] [address]    Report Status PENDING  Incomplete         Radiology Studies: No results found.      Scheduled Meds: . amLODipine  10 mg Oral Daily  . aspirin EC  81 mg Oral Daily  . bictegravir-emtricitabine-tenofovir AF  1 tablet Oral Daily  . Chlorhexidine Gluconate Cloth  6 each Topical Daily  . dextromethorphan-guaiFENesin  1 tablet Oral BID  . enoxaparin (LOVENOX) injection  80 mg Subcutaneous Q24H  . metoprolol tartrate  12.5 mg Oral BID  . pantoprazole  40 mg Oral Daily  . sodium chloride flush  10-40 mL Intracatheter Q12H   Continuous Infusions: . sodium chloride Stopped (12/03/19 1108)  . cefTRIAXone (ROCEPHIN)  IV 2 g (12/06/19 0920)     LOS: 5 days   Alwyn RenElizabeth G Semaj Coburn, MD  12/06/2019, 1:27 PM

## 2019-12-07 LAB — CREATININE, SERUM
Creatinine, Ser: 1.14 mg/dL (ref 0.61–1.24)
GFR, Estimated: >60 mL/min (ref >60)

## 2019-12-07 NOTE — Plan of Care (Signed)
  Problem: Clinical Measurements: Goal: Diagnostic test results will improve Outcome: Progressing   Problem: Coping: Goal: Level of anxiety will decrease Outcome: Progressing   Problem: Pain Managment: Goal: General experience of comfort will improve Outcome: Progressing   Problem: Education: Goal: Knowledge of General Education information will improve Description: Including pain rating scale, medication(s)/side effects and non-pharmacologic comfort measures Outcome: Progressing   Problem: Health Behavior/Discharge Planning: Goal: Ability to manage health-related needs will improve Outcome: Progressing   Problem: Clinical Measurements: Goal: Ability to maintain clinical measurements within normal limits will improve Outcome: Progressing Goal: Will remain free from infection Outcome: Progressing Goal: Diagnostic test results will improve Outcome: Progressing Goal: Respiratory complications will improve Outcome: Progressing Goal: Cardiovascular complication will be avoided Outcome: Progressing   Problem: Activity: Goal: Risk for activity intolerance will decrease Outcome: Progressing   Problem: Nutrition: Goal: Adequate nutrition will be maintained Outcome: Progressing   Problem: Coping: Goal: Level of anxiety will decrease Outcome: Progressing   Problem: Elimination: Goal: Will not experience complications related to bowel motility Outcome: Progressing Goal: Will not experience complications related to urinary retention Outcome: Progressing   Problem: Pain Managment: Goal: General experience of comfort will improve Outcome: Progressing   Problem: Safety: Goal: Ability to remain free from injury will improve Outcome: Progressing   Problem: Skin Integrity: Goal: Risk for impaired skin integrity will decrease Outcome: Progressing

## 2019-12-07 NOTE — Progress Notes (Signed)
PROGRESS NOTE    Jimmy Fields  DUK:[id number] DOB: 06-01-1990 DOA: 11/30/2019 PCP: Patient, No Pcp Per   Brief Narrative: HPI by Dr. Dairl Ponder This is a 29 year old male with past medical history of [facility],hypertension who presented with a left foot pain x1 day which has gradually been worsening. Unable to bear weight.  Patient stated that he was in his usual state of health until yesterday when he was leaving the [employer] store and had sudden onset of left ankle pain and inability to bear weight. Admitted to fevers up to 103 over the past 48 hours which have been declining and noted nausea, vomiting and a cough to the ED doc. Admitted to having STIs in the past including syphilis and was worked up for gonorrhea in the past but not sure if he was potitive or treated. Also admitted to some discomfort of his right elbow, wrist and right digits as well as right ankle pain. Denied any trauma to his ankle. No noted erythema or warmth. States that he has been dating a new partner for the past 2 months but also had a sexual partner before that but does not believe that either of them have any symptoms. Admits to intermittent left and right-sided chest pain. He was unaware of any of the rashes on his feet noted during my exam. Denies any penile discharge or urinary complaints. He denies any known personal or family history of any rheumatologic disorders.  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  Seen by ID patient on ceftriaxone 2 g IV daily for 14 days.  PICC line placed.  MRI left ankle concerning for septic  arthritis and tenosynovitis. Consulted IR and Dr. Ophelia Charter. DME ordered Insurance has not authorized for outpatient antibiotics yet therefore patient remains in the hospital at least till tomorrow.  #2 history of essential hypertension continue Norvasc.  #3 history of HIV/syphilis follows at [facility].  On [place].    Estimated body mass index is 49.79 kg/m as calculated from the following:   Height as of this encounter: 5\' 10"  (1.778 m).   Weight as of this encounter: 157.4 kg.  DVT prophylaxis: Lovenox  code Status: Full code Family Communication: None at bedside  disposition Plan:  Status is: Inpatient   Dispo: The patient is from: Home              Anticipated d/c is to: Home              Anticipated d/c date is: 2 days              Patient currently is medically stable however insurance has not authorized for home IV antibiotics.STILL WAITING ON INSURANCE AUTH.CALLED [id number] CASE [id number] Members ID [id number] Fax #[phone]   Consultants: Infectious disease  Procedures: None Antimicrobials Rocephin  Subjective: Patient resting in bed he reports left ankle pain is better Objective: Vitals:   12/06/19 0435 12/06/19 1401 12/06/19 2054 12/07/19 0527  BP: (!) 148/86 (!) 154/95 (!) 158/89 (!) 159/91  Pulse: 80 87 83 72  Resp: 17 18 16 18   Temp: 98.6 F (37 C) 98.3 F (  36.8 C) 98.2 F (36.8 C) 98.6 F (37 C)  TempSrc: Oral Oral    SpO2:  98% 100% 100%  Weight:      Height:        Intake/Output Summary (Last 24 hours) at 12/07/2019 1321 Last data filed at 12/07/2019 0900 Gross per 24 hour  Intake 883.33 ml  Output --  Net 883.33 ml   Filed Weights   11/30/19 2207  Weight: (!) 157.4 kg    Examination:  General exam: Appears calm and comfortable  Respiratory system: Clear to auscultation. Respiratory effort normal. Cardiovascular system: S1 & S2 heard, RRR. No JVD, murmurs, rubs, gallops or clicks. No pedal edema. Gastrointestinal  system: Abdomen is nondistended, soft and nontender. No organomegaly or masses felt. Normal bowel sounds heard. Central nervous system: Alert and oriented. No focal neurological deficits. Extremities: Swollen left ankle Skin: Multiple erythematous lesions in the lower extremities noted Psychiatry: Judgement and insight appear normal. Mood & affect appropriate.     Data Reviewed: I have personally reviewed following labs and imaging studies  CBC: Recent Labs  Lab 12/01/19 0317 12/02/19 0349  WBC 19.8* 15.8*  NEUTROABS  --  10.6*  HGB 14.1 14.1  HCT 44.5 44.9  MCV 83.6 83.9  PLT 265 264   Basic Metabolic Panel: Recent Labs  Lab 12/01/19 0317 12/02/19 0349 12/07/19 0349  NA 135 135  --   K 3.9 3.9  --   CL 101 97*  --   CO2 24 26  --   GLUCOSE 106* 124*  --   BUN 10 11  --   CREATININE 1.05 1.03 1.14  CALCIUM 8.6* 9.0  --   MG  --  2.2  --    GFR: Estimated Creatinine Clearance: 144.4 mL/min (by C-G formula based on SCr of 1.14 mg/dL). Liver Function Tests: No results for input(s): AST, ALT, ALKPHOS, BILITOT, PROT, ALBUMIN in the last 168 hours. No results for input(s): LIPASE, AMYLASE in the last 168 hours. No results for input(s): AMMONIA in the last 168 hours. Coagulation Profile: No results for input(s): INR, PROTIME in the last 168 hours. Cardiac Enzymes: No results for input(s): CKTOTAL, CKMB, CKMBINDEX, TROPONINI in the last 168 hours. BNP (last 3 results) No results for input(s): PROBNP in the last 8760 hours. HbA1C: No results for input(s): HGBA1C in the last 72 hours. CBG: No results for input(s): GLUCAP in the last 168 hours. Lipid Profile: No results for input(s): CHOL, HDL, LDLCALC, TRIG, CHOLHDL, LDLDIRECT in the last 72 hours. Thyroid Function Tests: No results for input(s): TSH, T4TOTAL, FREET4, T3FREE, THYROIDAB in the last 72 hours. Anemia Panel: No results for input(s): VITAMINB12, FOLATE, FERRITIN, TIBC, IRON, RETICCTPCT in the last 72  hours. Sepsis Labs: No results for input(s): PROCALCITON, LATICACIDVEN in the last 168 hours.  Recent Results (from the past 240 hour(s))  Respiratory Panel by RT PCR (Flu A&B, Covid) -     Status: None   Collection Time: 11/30/19  4:36 AM   Specimen: Nasopharyngeal  Result Value Ref Range Status   SARS Coronavirus 2 by RT PCR NEGATIVE NEGATIVE Final    Comment: (NOTE) SARS-CoV-2 target nucleic acids are NOT DETECTED.  The SARS-CoV-2 RNA is generally detectable in upper respiratoy specimens during the acute phase of infection. The lowest concentration of SARS-CoV-2 viral copies this assay can detect is 131 copies/mL. A negative result does not preclude SARS-Cov-2 infection and should not be used as the sole basis for treatment or other patient management  decisions. A negative result may occur with  improper specimen collection/handling, submission of specimen other than nasopharyngeal swab, presence of viral mutation(s) within the areas targeted by this assay, and inadequate number of viral copies (<131 copies/mL). A negative result must be combined with clinical observations, patient history, and epidemiological information. The expected result is Negative.  Fact Sheet for Patients:  https://www.moore.com/  Fact Sheet for Healthcare Providers:  https://www.young.biz/  This test is no t yet approved or cleared by the [place] FDA and  has been authorized for detection and/or diagnosis of SARS-CoV-2 by FDA under an Emergency Use Authorization (EUA). This EUA will remain  in effect (meaning this test can be used) for the duration of the COVID-19 declaration under Section 564(b)(1) of the Act, 21 U.S.C. section 360bbb-3(b)(1), unless the authorization is terminated or revoked sooner.     Influenza A by PCR NEGATIVE NEGATIVE Final   Influenza B by PCR NEGATIVE NEGATIVE Final    Comment: (NOTE) The Xpert Xpress SARS-CoV-2/FLU/RSV assay  is intended as an aid in  the diagnosis of influenza from Nasopharyngeal swab specimens and  should not be used as a sole basis for treatment. Nasal washings and  aspirates are unacceptable for Xpert Xpress SARS-CoV-2/FLU/RSV  testing.  Fact Sheet for Patients: https://www.moore.com/  Fact Sheet for Healthcare Providers: https://www.young.biz/  This test is not yet approved or cleared by the [place] FDA and  has been authorized for detection and/or diagnosis of SARS-CoV-2 by  FDA under an Emergency Use Authorization (EUA). This EUA will remain  in effect (meaning this test can be used) for the duration of the  Covid-19 declaration under Section 564(b)(1) of the Act, 21  U.S.C. section 360bbb-3(b)(1), unless the authorization is  terminated or revoked. Performed at [facility], [address], [place], [place] [address]   Culture, blood (routine x 2)     Status: None   Collection Time: 11/30/19  9:40 AM   Specimen: BLOOD LEFT HAND  Result Value Ref Range Status   Specimen Description   Final    BLOOD LEFT HAND Performed at [facility], [address], [place], [place] [address]    Special Requests   Final    BOTTLES DRAWN AEROBIC ONLY Blood Culture adequate volume Performed at [facility], [address], [place], [place] [address]    Culture   Final    NO GROWTH 5 DAYS Performed at [facility], [address], [place], [place] [address]    Report Status 12/05/2019 FINAL  Final  Culture, blood (routine x 2)     Status: None   Collection Time: 11/30/19 10:12 AM   Specimen: BLOOD RIGHT HAND  Result Value Ref Range Status   Specimen Description   Final    BLOOD RIGHT HAND Performed at [facility], [address] [place], [place] [address]    Special Requests   Final    BOTTLES DRAWN AEROBIC AND ANAEROBIC Blood Culture adequate  volume Performed at [facility], [address], [place], [place] [address]    Culture   Final    NO GROWTH 5 DAYS Performed at [facility], [address] [place], [place] [address]    Report Status 12/05/2019 FINAL  Final  Body fluid culture     Status: None (Preliminary result)   Collection Time: 12/04/19  6:35 PM   Specimen: Ankle; Body Fluid  Result Value Ref Range Status  Specimen Description   Final    ANKLE SYNOVIAL Performed at [facility], [address], [place], [place] [address]    Special Requests   Final    Immunocompromised Performed at [facility], [address] [place], [place] [address]    Gram Stain   Final    FEW WBC PRESENT,BOTH PMN AND MONONUCLEAR NO ORGANISMS SEEN    Culture   Final    NO GROWTH 3 DAYS Performed at [facility], [address], [place], [place] [address]    Report Status PENDING  Incomplete         Radiology Studies: No results found.      Scheduled Meds: . amLODipine  10 mg Oral Daily  . aspirin EC  81 mg Oral Daily  . bictegravir-emtricitabine-tenofovir AF  1 tablet Oral Daily  . Chlorhexidine Gluconate Cloth  6 each Topical Daily  . dextromethorphan-guaiFENesin  1 tablet Oral BID  . enoxaparin (LOVENOX) injection  80 mg Subcutaneous Q24H  . metoprolol tartrate  12.5 mg Oral BID  . pantoprazole  40 mg Oral Daily  . sodium chloride flush  10-40 mL Intracatheter Q12H   Continuous Infusions: . sodium chloride Stopped (12/03/19 1108)  . cefTRIAXone (ROCEPHIN)  IV 2 g (12/07/19 0859)     LOS: 6 days   Alwyn Ren, MD  12/07/2019, 1:21 PM

## 2019-12-07 NOTE — Progress Notes (Signed)
Physical Therapy Treatment Patient Details Name: Jimmy Fields MRN: [id number] DOB: 1990-04-16 Today's Date: 12/07/2019    History of Present Illness Patient is a 29 year old male with past medical history of HIV on Biktarvy follows at [facility], syphilis s/p treatment, hypertension who presented with a left foot pain x1 day which has gradually been worsening.  Unable to bear weight.    PT Comments    Patient making excellent progress with Acute PT. He was able to advance gait distance to ~550' today and ambulated with reduced support using IV pole to steady or no device. Patient also advanced to stair training and was able to ascend/descend stairs with bil hand rails and cues for sequencing. No overt LOB noted during gait or stair training. Anticipate pt may progress further to be able to mobilize with no AD as his pain continues to decrease in bil feet. Acute PT will follow to progress as able.     Follow Up Recommendations  No PT follow up     Equipment Recommendations  3in1 (PT);Rolling walker with 5" wheels (pt may not need equipment pending progress)    Recommendations for Other Services       Precautions / Restrictions Precautions Precautions: Fall Restrictions Weight Bearing Restrictions: No    Mobility  Bed Mobility               General bed mobility comments: pt OOB in recliner  Transfers Overall transfer level: Needs assistance Equipment used: Rolling walker (2 wheeled) Transfers: Sit to/from Stand Sit to Stand: Supervision         General transfer comment: no assist required from recliner for power up, pt using bil UE's to rise.  Ambulation/Gait Ambulation/Gait assistance: Min guard;Supervision Gait Distance (Feet): 550 Feet Assistive device: IV Pole;None Gait Pattern/deviations: Step-through pattern;Decreased stride length;Antalgic Gait velocity: decr   General Gait Details: pt continues to walk on lateral aspects of Lt foot slightly but  demonstrates improved foot contact and heel toe pattern with Rt foot. No overt LOB noted with gait. Pt amb short distance of ~140' with no AD and had mild gait deviations however no LOB.   Stairs Stairs: Yes Stairs assistance: Min guard Stair Management: Two rails;Step to pattern;Forwards Number of Stairs: 6 General stair comments: cues for sequencing "up with strong/down with weak". no overt LOB noted and pt able to complete with no c/o increased fot pain.   Wheelchair Mobility    Modified Rankin (Stroke Patients Only)       Balance Overall balance assessment: Needs assistance Sitting-balance support: Feet supported Sitting balance-Leahy Scale: Good     Standing balance support: During functional activity;Bilateral upper extremity supported Standing balance-Leahy Scale: Poor                              Cognition Arousal/Alertness: Awake/alert Behavior During Therapy: WFL for tasks assessed/performed Overall Cognitive Status: Within Functional Limits for tasks assessed                                        Exercises      General Comments        Pertinent Vitals/Pain Pain Assessment: Faces Faces Pain Scale: Hurts a little bit Pain Location: Lt foot Pain Descriptors / Indicators: Discomfort Pain Intervention(s): Limited activity within patient's tolerance;Monitored during session;Repositioned    Home Living  Prior Function            PT Goals (current goals can now be found in the care plan section) Acute Rehab PT Goals Patient Stated Goal: return home and be able to walk indepenently again PT Goal Formulation: With patient Time For Goal Achievement: 12/17/19 Potential to Achieve Goals: Good Progress towards PT goals: Progressing toward goals    Frequency    Min 4X/week      PT Plan Current plan remains appropriate    Co-evaluation              AM-PAC PT "6 Clicks" Mobility    Outcome Measure  Help needed turning from your back to your side while in a flat bed without using bedrails?: None Help needed moving from lying on your back to sitting on the side of a flat bed without using bedrails?: None Help needed moving to and from a bed to a chair (including a wheelchair)?: None Help needed standing up from a chair using your arms (e.g., wheelchair or bedside chair)?: A Little Help needed to walk in hospital room?: A Little Help needed climbing 3-5 steps with a railing? : A Little 6 Click Score: 21    End of Session Equipment Utilized During Treatment: Gait belt Activity Tolerance: Patient tolerated treatment well Patient left: in chair;with call bell/phone within reach;with nursing/sitter in room Nurse Communication: Mobility status PT Visit Diagnosis: Difficulty in walking, not elsewhere classified (R26.2);Pain;Other abnormalities of gait and mobility (R26.89) Pain - Right/Left: Left Pain - part of body: Ankle and joints of foot     Time: 4010-2725 PT Time Calculation (min) (ACUTE ONLY): 13 min  Charges:  $Gait Training: 8-22 mins                     Wynn Maudlin, DPT Acute Rehabilitation Services  Office [phone] Pager [phone]  12/07/2019 2:22 PM

## 2019-12-07 NOTE — Progress Notes (Signed)
Patient notified this nurse that his insurance company just approved the antibiotics. Notified Dr, Ashley Royalty. Wants patient to stay one more night, then d/c patient tomorrow am. Notified patient of reasoning. Patient denies pain at this time. Call bell within reach. Resting comfortably.

## 2019-12-07 NOTE — Plan of Care (Signed)
  Problem: Clinical Measurements: Goal: Diagnostic test results will improve Outcome: Progressing   Problem: Coping: Goal: Level of anxiety will decrease Outcome: Progressing   Problem: Pain Managment: Goal: General experience of comfort will improve Outcome: Progressing   Problem: Education: Goal: Knowledge of General Education information will improve Description: Including pain rating scale, medication(s)/side effects and non-pharmacologic comfort measures Outcome: Progressing   Problem: Health Behavior/Discharge Planning: Goal: Ability to manage health-related needs will improve Outcome: Progressing   Problem: Activity: Goal: Risk for activity intolerance will decrease Outcome: Progressing   Problem: Nutrition: Goal: Adequate nutrition will be maintained Outcome: Progressing   Problem: Coping: Goal: Level of anxiety will decrease Outcome: Progressing   Problem: Elimination: Goal: Will not experience complications related to bowel motility Outcome: Progressing Goal: Will not experience complications related to urinary retention Outcome: Progressing   Problem: Pain Managment: Goal: General experience of comfort will improve Outcome: Progressing   Problem: Safety: Goal: Ability to remain free from injury will improve Outcome: Progressing   Problem: Skin Integrity: Goal: Risk for impaired skin integrity will decrease Outcome: Progressing

## 2019-12-08 LAB — BODY FLUID CULTURE: Culture: NO GROWTH

## 2019-12-08 MED ORDER — HEPARIN SOD (PORK) LOCK FLUSH 100 UNIT/ML IV SOLN
250.0000 [IU] | INTRAVENOUS | Status: AC | PRN
  Administered 2019-12-08: 250 [IU]
  Filled 2019-12-08: qty 2.5

## 2019-12-08 NOTE — Progress Notes (Signed)
Pt provided with d/c instructions. After discussing the pt's plan of care upon d/c home, pt denied any further questions or concerns.  

## 2019-12-08 NOTE — Discharge Summary (Signed)
Physician Discharge Summary  Jimmy Fields VFI:[id number] DOB: 05-28-1990 DOA: 11/30/2019  PCP: Patient, No Pcp Per  Admit date: 11/30/2019 Discharge date: 12/08/2019  Admitted From: Home Disposition: Home Recommendations for Outpatient Follow-up:  1. Follow up with PCP in 1-2 weeks 2. Please obtain BMP/CBC in one week 3. Please follow up with [place]: Yes Equipment/Devices: PICC line Discharge Condition: Stable CODE STATUS: Full code Diet recommendation: Cardiac Brief/Interim Summary:This is a 29 year old male with past medical history [facility]ension who presented with a left foot pain x1 day which has gradually been worsening. Unable to bear weight.  Patient statedthat he was in his usual state of health until yesterday when he was leaving the [employer] store and had sudden onset of left ankle pain and inability to bear weight. Admittedto fevers up to 103 over the past 48 hours which have been declining and noted nausea, vomiting and a cough to the ED doc. Admittedto having STIs in the past including syphilis and was worked up for gonorrhea in the past but not sure if he was potitive or treated. Also admittedto some discomfort of his right elbow, wrist and right digits as well as right ankle pain. Deniedany trauma to his ankle. No noted erythema or warmth. States that he has been dating a new partner for the past 2 months but also had a sexual partner before that but does not believe that either of them have any symptoms. Admits to intermittent left and right-sided chest pain. He was unaware of any of the rashes on his feet noted during my exam. Denies any penile discharge or urinary complaints. He denies any known personal or family history of any rheumatologic disorders.   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  Seen by ID patient on ceftriaxone 2 g IV daily for 14 days.  PICC line placed.  Opat orders placed. MRI left ankle concerning for septic arthritis and tenosynovitis. Consulted Ortho Dr. Lorin Mercy who aspirated the left ankle aspirate revealed no organisms or crystals.  #2 history of essential hypertension continue Norvasc.  Added metoprolol for better control of blood pressure.  #3 history of HIV/syphilis follows at [facility].  On biktarvy  Estimated body mass index is 49.79 kg/m as calculated from the following:   Height as of this encounter: 5' 10"  (1.778 m).   Weight as of this encounter: 157.4 kg.  Discharge Instructions  Discharge Instructions    [facility] pharmacist to adjust dose for Vancomycin, Aminoglycosides and other anti-infective therapies as requested by physician.   Complete by: As directed    [facility] to provide Cath Flo 15m   Complete by: As directed    Administer for PICC line occlusion and as ordered by physician for other access device issues.   Anaphylaxis Kit: Provided to treat any anaphylactic reaction to the medication being provided to the patient if First Dose or when requested by physician   Complete by: As directed    Epinephrine 160mml vial / amp: Administer 0.57m80m0.57ml89mubcutaneously once for moderate to severe anaphylaxis, nurse to call physician and pharmacy when reaction  occurs and call 911 if needed for immediate care   Diphenhydramine 28m/ml IV vial: Administer 25-566mIV/IM PRN for first dose reaction, rash, itching, mild reaction, nurse to call physician and pharmacy when reaction occurs   Sodium Chloride 0.9% NS 50021mV: Administer if needed for hypovolemic blood  pressure drop or as ordered by physician after call to physician with anaphylactic reaction   Change dressing on IV access line weekly and PRN   Complete by: As directed    Diet - low sodium heart healthy   Complete by: As directed    Diet - low sodium heart healthy   Complete by: As directed    Diet - low sodium heart healthy   Complete by: As directed    Flush IV access with Sodium Chloride 0.9% and Heparin 10 units/ml or 100 units/ml   Complete by: As directed    Home infusion instructions - [facility]   Complete by: As directed    Instructions: Flush IV access with Sodium Chloride 0.9% and Heparin 10units/ml or 100units/ml   Change dressing on IV access line: Weekly and PRN   Instructions Cath Flo 2mg47mdminister for PICC Line occlusion and as ordered by physician for other access device   [facility] pharmacist to adjust dose for: Vancomycin, Aminoglycosides and other anti-infective therapies as requested by physician   Increase activity slowly   Complete by: As directed    Increase activity slowly   Complete by: As directed    Increase activity slowly   Complete by: As directed    Method of administration may be changed at the discretion of home infusion pharmacist based upon assessment of the patient and/or caregiver's ability to self-administer the medication ordered   Complete by: As directed    No wound care   Complete by: As directed    No wound care   Complete by: As directed    No wound care   Complete by: As directed      Allergies as of 12/08/2019      Reactions   Fish Allergy Nausea And Vomiting   Shellfish Allergy Hives, Nausea And Vomiting      Medication List    STOP taking these medications   Fish Oil 1000 MG Caps   Oxycodone HCl 10 MG Tabs   polyethylene glycol 17 g packet Commonly known as: MiraLax     TAKE these medications   acetaminophen 500 MG tablet Commonly known as: TYLENOL Take 500 mg by mouth every 6 (six) hours  as needed.   amLODipine 10 MG tablet Commonly known as: NORVASC Take 10 mg by mouth daily.   aspirin EC 81 MG tablet Take 81 mg by mouth daily.   Biktarvy 50-200-25 MG Tabs tablet Generic drug: bictegravir-emtricitabine-tenofovir AF Take 1 tablet by mouth daily.   cefTRIAXone  IVPB Commonly known as: ROCEPHIN Inject 2 g into the vein daily for 10 days. Indication:  Disseminated gonorrhea First Dose: Yes Last Day of Therapy:  12/13/2019 Labs - Once weekly:  CBC/D and BMP, Labs - Every other week:  ESR and CRP Method of administration: IV Push Method of administration may be changed at the discretion of home infusion pharmacist based upon assessment of the patient and/or caregiver's ability to self-administer the medication ordered.   cholecalciferol 1000 units tablet Commonly known as: VITAMIN D Take 1,000 Units by mouth daily.   GARLIC PO Take 1 capsule by mouth daily.   HYDROcodone-acetaminophen 5-325 MG tablet Commonly known  as: NORCO/VICODIN Take 1-2 tablets by mouth every 4 (four) hours as needed for moderate pain.   ketoconazole 2 % cream Commonly known as: NIZORAL Apply 1 application topically daily. Apply to feet daily   metoprolol tartrate 25 MG tablet Commonly known as: LOPRESSOR Take 0.5 tablets (12.5 mg total) by mouth 2 (two) times daily.   Mucinex 600 MG 12 hr tablet Generic drug: guaiFENesin Take 600 mg by mouth daily.   MULTIVITAMIN ADULT PO Take 1 tablet by mouth daily.   omeprazole 40 MG capsule Commonly known as: PRILOSEC Take 40 mg by mouth daily.   ondansetron 8 MG tablet Commonly known as: ZOFRAN Take 8 mg by mouth every 8 (eight) hours as needed for nausea or vomiting.            Durable Medical Equipment  (From admission, onward)         Start     Ordered   12/04/19 1027  DME 3-in-1  Once        12/04/19 1028   12/04/19 1027  DME standard manual wheelchair with seat cushion  Once       Comments: Patient suffers from left  ankle septic arthritis which impairs their ability to perform daily activities like grooming combing brushing showering in the home.  A walker will not resolve issue with performing activities of daily living. A wheelchair will allow patient to safely perform daily activities. Patient can safely propel the wheelchair in the home or has a caregiver who can provide assistance. Length of need 99 days. Accessories: elevating leg rests (ELRs), wheel locks, extensions and anti-tippers.   12/04/19 1028   12/04/19 1026  DME Walker  Once       Question Answer Comment  Walker: With [place] Whe[employer]   Patient needs a walker to treat with the following condition Unsteady gait      12/04/19 1028   12/04/19 1024  For home use only DME Walker  Once       Question:  Patient needs a walker to treat with the following condition  Answer:  Unsteady   12/04/19 1024           Discharge Care Instructions  (From admission, onward)         Start     Ordered   12/04/19 0000  Change dressing on IV access line weekly and PRN  (Home infusion instructions - [facility] )        12/04/19 1025          Allergies  Allergen Reactions  . Fish Allergy Nausea And Vomiting  . Shellfish Allergy Hives and Nausea And Vomiting    Consultations:  Infectious disease, orthopedics   Procedures/Studies: MR ANKLE LEFT W WO CONTRAST  Result Date: 12/03/2019 CLINICAL DATA:  Left ankle pain.  Disseminated gonococcal infection. EXAM: MRI OF THE LEFT ANKLE WITHOUT AND WITH CONTRAST TECHNIQUE: Multiplanar, multisequence MR imaging of the ankle was performed before and after the administration of intravenous contrast. CONTRAST:  55m GADAVIST GADOBUTROL 1 MMOL/ML IV SOLN COMPARISON:  Left foot x-rays dated November 30, 2019. FINDINGS: TENDONS Peroneal: Peroneal longus tendon intact. Peroneal brevis intact. Posteromedial: Posterior tibial tendon intact. Flexor hallucis longus tendon intact with prominent fluid in the  tendon sheath extending from the level of the tibiotalar joint to the master knot of HMallie Mussel There is associated synovial enhancement. Flexor digitorum longus tendon intact. Anterior: Tibialis anterior tendon intact. Extensor hallucis longus tendon intact Extensor digitorum longus tendon  intact. Achilles:  Intact. Plantar Fascia: Intact. LIGAMENTS Lateral: Anterior talofibular ligament intact. Calcaneofibular ligament intact. Posterior talofibular ligament intact. Anterior and posterior tibiofibular ligaments intact. Medial: Deltoid ligament intact. Spring ligament intact. CARTILAGE Ankle Joint: Moderate joint effusion with synovial enhancement. Normal ankle mortise. No chondral defect. Subtalar Joints/Sinus Tarsi: Normal subtalar joints. Moderate posterior subtalar joint effusion with synovial enhancement. Normal sinus tarsi. Bones: No marrow signal abnormality. No fracture or dislocation. Os trigonum. Soft Tissue: Mild diffuse soft tissue swelling. No soft tissue mass or fluid collection. IMPRESSION: IMPRESSION 1. Moderate tibiotalar and posterior subtalar joint effusions with synovial enhancement, concerning for septic arthritis. 2. Infectious flexor hallucis longus tenosynovitis extending from the level of the tibiotalar joint to the master knot of Mallie Mussel. 3. No osteomyelitis or abscess. Electronically Signed   By: Titus Dubin M.D.   On: 12/03/2019 11:37   DG Foot Complete Left  Result Date: 11/30/2019 CLINICAL DATA:  Left foot pain. EXAM: LEFT FOOT - COMPLETE 3+ VIEW COMPARISON:  None. FINDINGS: There is no evidence of fracture or dislocation. There is no evidence of arthropathy or other focal bone abnormality. Soft tissues are unremarkable. IMPRESSION: Negative. Electronically Signed   By: Virgina Norfolk M.D.   On: 11/30/2019 03:02   [place] EKG SITE RITE  Result Date: 12/03/2019 If Site Rite image not attached, placement could not be confirmed due to current cardiac rhythm.  [place] EKG SITE  RITE  Result Date: 12/02/2019 If Site Rite image not attached, placement could not be confirmed due to current cardiac rhythm.   (Echo, Carotid, EGD, Colonoscopy, ERCP)    Subjective: Patient resting in bed anxious to go home Able to bear weight on the left ankle  Discharge Exam: Vitals:   12/07/19 2323 12/08/19 0620  BP: (!) 174/99 (!) 160/100  Pulse: 80 66  Resp: 15 20  Temp: 98.3 F (36.8 C) 98 F (36.7 C)  SpO2: 99% 99%   Vitals:   12/07/19 0527 12/07/19 1343 12/07/19 2323 12/08/19 0620  BP: (!) 159/91 (!) 151/93 (!) 174/99 (!) 160/100  Pulse: 72 76 80 66  Resp: 18 20 15 20   Temp: 98.6 F (37 C) 98.3 F (36.8 C) 98.3 F (36.8 C) 98 F (36.7 C)  TempSrc:  Oral Oral Axillary  SpO2: 100% 100% 99% 99%  Weight:      Height:        General: Pt is alert, awake, not in acute distress Cardiovascular: RRR, S1/S2 +, no rubs, no gallops Respiratory: CTA bilaterally, no wheezing, no rhonchi Abdominal: Soft, NT, ND, bowel sounds + Extremities: no edema, no cyanosis    The results of significant diagnostics from this hospitalization (including imaging, microbiology, ancillary and laboratory) are listed below for reference.     Microbiology: Recent Results (from the past 240 hour(s))  Respiratory Panel by RT PCR (Flu A&B, Covid) -     Status: None   Collection Time: 11/30/19  4:36 AM   Specimen: Nasopharyngeal  Result Value Ref Range Status   SARS Coronavirus 2 by RT PCR NEGATIVE NEGATIVE Final    Comment: (NOTE) SARS-CoV-2 target nucleic acids are NOT DETECTED.  The SARS-CoV-2 RNA is generally detectable in upper respiratoy specimens during the acute phase of infection. The lowest concentration of SARS-CoV-2 viral copies this assay can detect is 131 copies/mL. A negative result does not preclude SARS-Cov-2 infection and should not be used as the sole basis for treatment or other patient management decisions. A negative result may occur with  improper specimen  collection/handling, submission of specimen other than nasopharyngeal swab, presence of viral mutation(s) within the areas targeted by this assay, and inadequate number of viral copies (<131 copies/mL). A negative result must be combined with clinical observations, patient history, and epidemiological information. The expected result is Negative.  Fact Sheet for Patients:  PinkCheek.be  Fact Sheet for Healthcare Providers:  GravelBags.it  This test is no t yet approved or cleared by the [place] FDA and  has been authorized for detection and/or diagnosis of SARS-CoV-2 by FDA under an Emergency Use Authorization (EUA). This EUA will remain  in effect (meaning this test can be used) for the duration of the COVID-19 declaration under Section 564(b)(1) of the Act, 21 U.S.C. section 360bbb-3(b)(1), unless the authorization is terminated or revoked sooner.     Influenza A by PCR NEGATIVE NEGATIVE Final   Influenza B by PCR NEGATIVE NEGATIVE Final    Comment: (NOTE) The Xpert Xpress SARS-CoV-2/FLU/RSV assay is intended as an aid in  the diagnosis of influenza from Nasopharyngeal swab specimens and  should not be used as a sole basis for treatment. Nasal washings and  aspirates are unacceptable for Xpert Xpress SARS-CoV-2/FLU/RSV  testing.  Fact Sheet for Patients: PinkCheek.be  Fact Sheet for Healthcare Providers: GravelBags.it  This test is not yet approved or cleared by the [place] FDA and  has been authorized for detection and/or diagnosis of SARS-CoV-2 by  FDA under an Emergency Use Authorization (EUA). This EUA will remain  in effect (meaning this test can be used) for the duration of the  Covid-19 declaration under Section 564(b)(1) of the Act, 21  U.S.C. section 360bbb-3(b)(1), unless the authorization is  terminated or revoked. Performed at [facility], [place] [address], [place], [place] [address]   Culture, blood (routine x 2)     Status: None   Collection Time: 11/30/19  9:40 AM   Specimen: BLOOD LEFT HAND  Result Value Ref Range Status   Specimen Description   Final    BLOOD LEFT HAND Performed at [facility] [address], [place], [place] [address]    Special Requests   Final    BOTTLES DRAWN AEROBIC ONLY Blood Culture adequate volume Performed at [facility] [address] [place], [place] [address]    Culture   Final    NO GROWTH 5 DAYS Performed at [facility], [place] [address], [place], [place] [address]    Report Status 12/05/2019 FINAL  Final  Culture, blood (routine x 2)     Status: None   Collection Time: 11/30/19 10:12 AM   Specimen: BLOOD RIGHT HAND  Result Value Ref Range Status   Specimen Description   Final    BLOOD RIGHT HAND Performed at [facility] [address] [place], [place] [address]    Special Requests   Final    BOTTLES DRAWN AEROBIC AND ANAEROBIC Blood Culture adequate volume Performed at [facility] [address], [place], [place] [address]    Culture   Final    NO GROWTH 5 DAYS Performed at [facility], [place] [address], [place], [place] [address]    Report Status 12/05/2019 FINAL  Final  Body fluid culture     Status: None   Collection Time: 12/04/19  6:35 PM   Specimen: Ankle; Body Fluid  Result Value Ref Range Status   Specimen Description   Final    ANKLE SYNOVIAL Performed  at [facility], [place] [address] [place], [place] [address]    Special Requests   Final    Immunocompromised Performed at [facility], [place] [address], [place], [place] [address]    Gram Stain   Final    FEW WBC PRESENT,BOTH PMN AND MONONUCLEAR NO ORGANISMS SEEN    Culture   Final    NO GROWTH 3 DAYS Performed at [facility], [place] [address] [place], [place] [address]    Report Status 12/08/2019 FINAL  Final     Labs: BNP (last 3 results) No results for input(s): BNP in the last 8760 hours. Basic Metabolic Panel: Recent Labs  Lab 12/02/19 0349 12/07/19 0349  NA 135  --   K 3.9  --   CL 97*  --   CO2 26  --   GLUCOSE 124*  --   BUN 11  --   CREATININE 1.03 1.14  CALCIUM 9.0  --   MG 2.2  --    Liver Function Tests: No results for input(s): AST, ALT, ALKPHOS, BILITOT, PROT, ALBUMIN in the last 168 hours. No results for input(s): LIPASE, AMYLASE in the last 168 hours. No results for input(s): AMMONIA in the last 168 hours. CBC: Recent Labs  Lab 12/02/19 0349  WBC 15.8*  NEUTROABS 10.6*  HGB 14.1  HCT 44.9  MCV 83.9  PLT 264   Cardiac Enzymes: No results for input(s): CKTOTAL, CKMB, CKMBINDEX, TROPONINI in the last 168 hours. BNP: Invalid input(s): POCBNP CBG: No results for input(s): GLUCAP in the last 168 hours. D-Dimer No results for input(s): DDIMER in the last 72 hours. Hgb A1c No results for input(s): HGBA1C in the last 72 hours. Lipid Profile No results for input(s): CHOL, HDL, LDLCALC, TRIG, CHOLHDL, LDLDIRECT in the last 72 hours. Thyroid function studies No results for input(s): TSH, T4TOTAL, T3FREE, THYROIDAB in the last 72 hours.  Invalid input(s): FREET3 Anemia work up No results for input(s): VITAMINB12, FOLATE, FERRITIN, TIBC, IRON, RETICCTPCT in the last 72 hours. Urinalysis    Component Value Date/Time   COLORURINE YELLOW 11/30/2019 0436   APPEARANCEUR CLEAR 11/30/2019 0436   LABSPEC 1.028 11/30/2019 0436   PHURINE 6.0 11/30/2019 0436   GLUCOSEU NEGATIVE 11/30/2019 0436   HGBUR NEGATIVE 11/30/2019 0436   BILIRUBINUR NEGATIVE 11/30/2019 0436   KETONESUR 5 (A) 11/30/2019 0436   PROTEINUR 30 (A) 11/30/2019 0436   UROBILINOGEN 0.2 10/20/2013 1700   NITRITE NEGATIVE 11/30/2019 0436   LEUKOCYTESUR NEGATIVE 11/30/2019 0436   Sepsis Labs Invalid input(s): PROCALCITONIN,  WBC,   LACTICIDVEN Microbiology Recent Results (from the past 240 hour(s))  Respiratory Panel by RT PCR (Flu A&B, Covid) -     Status: None   Collection Time: 11/30/19  4:36 AM   Specimen: Nasopharyngeal  Result Value Ref Range Status   SARS Coronavirus 2 by RT PCR NEGATIVE NEGATIVE Final    Comment: (NOTE) SARS-CoV-2 target nucleic acids are NOT DETECTED.  The SARS-CoV-2 RNA is generally detectable in upper respiratoy specimens during the acute phase of infection. The lowest concentration of SARS-CoV-2 viral copies this assay can detect is 131 copies/mL. A negative result does not preclude SARS-Cov-2 infection and should not be used as the sole basis for treatment or other patient management decisions. A negative result may occur with  improper specimen collection/handling, submission of specimen other than nasopharyngeal swab, presence of viral mutation(s) within the areas targeted by this assay, and inadequate number of viral copies (<131  copies/mL). A negative result must be combined with clinical observations, patient history, and epidemiological information. The expected result is Negative.  Fact Sheet for Patients:  PinkCheek.be  Fact Sheet for Healthcare Providers:  GravelBags.it  This test is no t yet approved or cleared by the [place] FDA and  has been authorized for detection and/or diagnosis of SARS-CoV-2 by FDA under an Emergency Use Authorization (EUA). This EUA will remain  in effect (meaning this test can be used) for the duration of the COVID-19 declaration under Section 564(b)(1) of the Act, 21 U.S.C. section 360bbb-3(b)(1), unless the authorization is terminated or revoked sooner.     Influenza A by PCR NEGATIVE NEGATIVE Final   Influenza B by PCR NEGATIVE NEGATIVE Final    Comment: (NOTE) The Xpert Xpress SARS-CoV-2/FLU/RSV assay is intended as an aid in  the diagnosis of influenza from  Nasopharyngeal swab specimens and  should not be used as a sole basis for treatment. Nasal washings and  aspirates are unacceptable for Xpert Xpress SARS-CoV-2/FLU/RSV  testing.  Fact Sheet for Patients: PinkCheek.be  Fact Sheet for Healthcare Providers: GravelBags.it  This test is not yet approved or cleared by the [place] FDA and  has been authorized for detection and/or diagnosis of SARS-CoV-2 by  FDA under an Emergency Use Authorization (EUA). This EUA will remain  in effect (meaning this test can be used) for the duration of the  Covid-19 declaration under Section 564(b)(1) of the Act, 21  U.S.C. section 360bbb-3(b)(1), unless the authorization is  terminated or revoked. Performed at [facility] [place] [address] [place], [place] [address]   Culture, blood (routine x 2)     Status: None   Collection Time: 11/30/19  9:40 AM   Specimen: BLOOD LEFT HAND  Result Value Ref Range Status   Specimen Description   Final    BLOOD LEFT HAND Performed at [facility] [address], [place], [place] [address]    Special Requests   Final    BOTTLES DRAWN AEROBIC ONLY Blood Culture adequate volume Performed at [facility] [address] [place], [place] [address]    Culture   Final    NO GROWTH 5 DAYS Performed at [facility], [place] [address] [place], [place] [address]    Report Status 12/05/2019 FINAL  Final  Culture, blood (routine x 2)     Status: None   Collection Time: 11/30/19 10:12 AM   Specimen: BLOOD RIGHT HAND  Result Value Ref Range Status   Specimen Description   Final    BLOOD RIGHT HAND Performed at [facility] [address] [place], [place] [address]    Special Requests   Final    BOTTLES DRAWN AEROBIC AND ANAEROBIC Blood Culture adequate volume Performed at [facility] [address]  [place], [place] [address]    Culture   Final    NO GROWTH 5 DAYS Performed at [facility], [place] [address] [place], [place] [address]    Report Status 12/05/2019 FINAL  Final  Body fluid culture     Status: None   Collection Time: 12/04/19  6:35 PM   Specimen: Ankle; Body Fluid  Result Value Ref Range Status   Specimen Description   Final    ANKLE SYNOVIAL Performed at [facility] [address] [place], [place] [address]    Special Requests   Final    Immunocompromised Performed  at [facility], [place] [address] [place], [place] [address]    Gram Stain   Final    FEW WBC PRESENT,BOTH PMN AND MONONUCLEAR NO ORGANISMS SEEN    Culture   Final    NO GROWTH 3 DAYS Performed at [facility], [place] [address], [place], [place] [address]    Report Status 12/08/2019 FINAL  Final     Time coordinating discharge:  39 minutes  SIGNED:   Georgette Shell, MD  [facility] 12/08/2019, 10:41 AM

## 2019-12-08 NOTE — TOC Transition Note (Signed)
Transition of Care [name]) - CM/SW Discharge Note   Patient Details  Name: Jimmy Fields MRN: [id number] Date of Birth: [birth year]  Transition of Care [facility]) CM/SW Contact:  Clearance Coots, LCSW Phone Number: 12/08/2019, 3:21 PM   Clinical Narrative:    CSW assisted the patient in calling his insurance to expedite the request for home IV antibiotics. Authorization received. [employer] staff Pam to be provide IV infusion education.   No other needs identified.   Final next level of care: Home w Home Health Services Barriers to Discharge: Barriers Resolved   Patient Goals and CMS Choice Patient states their goals for this hospitalization and ongoing recovery are:: get better      Discharge Placement                       Discharge Plan and Services In-house Referral: Clinical Social Work Discharge Planning Services: CM Consult Post Acute Care Choice: Home Health          DME Arranged: 3-N-1, Walker rolling, Wheelchair manual DME Agency: [employer] Date DME Agency Contacted: 12/04/19 Time DME Agency Contacted: 1121 Representative spoke with at DME Agency: Velna Hatchet HH Arranged: RN, IV Antibiotics HH Agency: [facility], [facility] Date [facility] Agency Contacted: 12/02/19 Time HH Agency Contacted: [id number]    Social Determinants of Health (SDOH) Interventions     Readmission Risk Interventions No flowsheet data found.

## 2019-12-09 ENCOUNTER — Telehealth: Payer: Self-pay | Admitting: *Deleted

## 2019-12-09 NOTE — Telephone Encounter (Signed)
Patient calling to arrange hospital follow up. He states he completes IV rocephin via PICC 11/14.  RN scheduled patient with Dr Renold Don 11/11 3:15, gave patient our address.  Notified [facility]. Andree Coss, RN

## 2019-12-10 ENCOUNTER — Other Ambulatory Visit: Payer: Self-pay

## 2019-12-10 ENCOUNTER — Encounter: Payer: Self-pay | Admitting: Internal Medicine

## 2019-12-10 ENCOUNTER — Ambulatory Visit (INDEPENDENT_AMBULATORY_CARE_PROVIDER_SITE_OTHER): Payer: PRIVATE HEALTH INSURANCE | Admitting: Internal Medicine

## 2019-12-10 ENCOUNTER — Telehealth: Payer: Self-pay

## 2019-12-10 VITALS — BP 146/90 | HR 86 | Temp 98.3°F | Ht 70.0 in | Wt 354.0 lb

## 2019-12-10 DIAGNOSIS — Z21 Asymptomatic human immunodeficiency virus [HIV] infection status: Secondary | ICD-10-CM

## 2019-12-10 DIAGNOSIS — B20 Human immunodeficiency virus [HIV] disease: Secondary | ICD-10-CM

## 2019-12-10 DIAGNOSIS — A5442 Gonococcal arthritis: Secondary | ICD-10-CM

## 2019-12-10 DIAGNOSIS — A5486 Gonococcal sepsis: Secondary | ICD-10-CM | POA: Diagnosis not present

## 2019-12-10 NOTE — Progress Notes (Signed)
[place] for Infectious Disease  Patient Active Problem List   Diagnosis Date Noted  . Joint pain 12/01/2019  . DGI (disseminated gonococcal infection) ([place])   . Polyarthralgia 11/30/2019  . Acute left ankle pain 11/30/2019  . HIV (human immunodeficiency virus infection) ([place])   . Essential hypertension   . Acute appendicitis s/p lap appendectomy 10/31/2017 10/25/2017    Patient's Medications  New Prescriptions   No medications on file  Previous Medications   ACETAMINOPHEN (TYLENOL) 500 MG TABLET    Take 500 mg by mouth every 6 (six) hours as needed.   AMLODIPINE (NORVASC) 10 MG TABLET    Take 10 mg by mouth daily.    ASPIRIN EC 81 MG TABLET    Take 81 mg by mouth daily.    BIKTARVY 50-200-25 MG TABS TABLET    Take 1 tablet by mouth daily.    CEFTRIAXONE (ROCEPHIN) IVPB    Inject 2 g into the vein daily for 10 days. Indication:  Disseminated gonorrhea First Dose: Yes Last Day of Therapy:  12/13/2019 Labs - Once weekly:  CBC/D and BMP, Labs - Every other week:  ESR and CRP Method of administration: IV Push Method of administration may be changed at the discretion of home infusion pharmacist based upon assessment of the patient and/or caregiver's ability to self-administer the medication ordered.   CHOLECALCIFEROL (VITAMIN D) 1000 UNITS TABLET    Take 1,000 Units by mouth daily.   GARLIC PO    Take 1 capsule by mouth daily.   GUAIFENESIN (MUCINEX) 600 MG 12 HR TABLET    Take 600 mg by mouth daily.   HYDROCODONE-ACETAMINOPHEN (NORCO/VICODIN) 5-325 MG TABLET    Take 1-2 tablets by mouth every 4 (four) hours as needed for moderate pain.   KETOCONAZOLE (NIZORAL) 2 % CREAM    Apply 1 application topically daily. Apply to feet daily   METOPROLOL TARTRATE (LOPRESSOR) 25 MG TABLET    Take 0.5 tablets (12.5 mg total) by mouth 2 (two) times daily.   MULTIPLE VITAMINS-MINERALS (HAIR SKIN AND NAILS FORMULA PO)    Take by mouth.   MULTIPLE VITAMINS-MINERALS (MULTIVITAMIN ADULT  PO)    Take 1 tablet by mouth daily.   MULTIPLE VITAMINS-MINERALS (ZINC PO)    Take by mouth.   NON FORMULARY    "Blood pressure support vitamin"   NON FORMULARY    "blood sugar support vitamin"   OMEPRAZOLE (PRILOSEC) 40 MG CAPSULE    Take 40 mg by mouth daily.   ONDANSETRON (ZOFRAN) 8 MG TABLET    Take 8 mg by mouth every 8 (eight) hours as needed for nausea or vomiting.   Modified Medications   No medications on file  Discontinued Medications   No medications on file    Subjective: Well controlled 29 yo hiv male patient recent admission earlier this month for rash/polyarticular arthritis found to have disseminated gonococcal infection via triple screen, here for f/u  10/29 ?urine gonorrhea positive; there are two other test but all was labeled from urine.   He was placed on ceftriaxone for 2 weeks and f/u here  Improved pain/swelling in ankles. Still some swelling/pain left ankle. Pain mostly on ambulating. Previously couldn't put weight  No washout was done/needed  No f/c No n/v/diarrhea Rash had resolved  No chlamydia infection prevoius syphilis treated; rpr 1:1  No eye pain/redness No penile discharge No penile/genital rash-ulcer   Review of Systems: ROS Negative 11 point ros unless mentioned  above  Past Medical History:  Diagnosis Date  . HIV (human immunodeficiency virus infection) ([place])   . Hypertension     Social History   Tobacco Use  . Smoking status: Former Smoker    Types: Cigarettes  . Smokeless tobacco: Never Used  Vaping Use  . Vaping Use: Never used  Substance Use Topics  . Alcohol use: Yes    Comment: socially  . Drug use: Yes    Frequency: 14.0 times per week    Types: Marijuana    Comment: 3-4 times a week    Family History  Problem Relation Age of Onset  . Heart failure Father     Allergies  Allergen Reactions  . Fish Allergy Nausea And Vomiting  . Shellfish Allergy Hives and Nausea And Vomiting    Objective: Vitals:    12/10/19 1528  BP: (!) 146/90  Pulse: 86  Temp: 98.3 F (36.8 C)  TempSrc: Oral  SpO2: 98%  Weight: (!) 354 lb (160.6 kg)  Height: 5' 10"  (1.778 m)   Body mass index is 50.79 kg/m.  Physical Exam No distress, well developed male, cooperative Normocephalic; per; conj clear; eomi Neck supple cv rrr no mrg Lungs clear abd s/nt Ext trace swelling left foot msk full rom bilateral ankle; some swelling/tenderness subtalar joint Neuro nonfocal  Labs: Reviewed Gonorrhea urine positive  Micro: Blood cx negative Joint fluid cx negative; 13k wbc  Imaging: Mri left ankle   1. Moderate tibiotalar and posterior subtalar joint effusions with synovial enhancement, concerning for septic arthritis. 2. Infectious flexor hallucis longus tenosynovitis extending from the level of the tibiotalar joint to the master knot of Mallie Mussel. 3. No osteomyelitis or abscess.  Assessment/plan Problem List Items Addressed This Visit      Other   DGI (disseminated gonococcal infection) ([place]) - Primary   Relevant Orders   C-reactive protein   CBC   Comp Met (CMET)    Other Visit Diagnoses    HIV disease ([place])         Given still moderate sx of pain/swelling left ankle, will extend abx for another week. Joint involvement so will keep on iv ceftriaxone 1 gram iv daily  -crp/cmp/cbc today  -hh to extend abx for another week  -patient will email short term disability paperwork for me to fill out  -f/u next week  -continue biktarvy for his hiv (well controlled) Lab Results  Component Value Date   CD4TCELL 40 12/01/2019   CD4TABS 1,165 12/01/2019   Lab Results  Component Value Date   HIV1RNAQUANT <20 12/01/2019    Jabier Mutton, [place] for Infectious [place] -- -- pager   [phone] cell 12/10/2019, 3:41 PM

## 2019-12-10 NOTE — Telephone Encounter (Signed)
Spoke with Tim at [facility] to relay verbal orders per Dr. Renold Don to extend IV antibiotics by one week. Orders repeated and verified.   Sandie Ano, RN

## 2019-12-10 NOTE — Patient Instructions (Signed)
You had disseminated gonoccocal infection, treatment was appropriate  However, there is still moderate pain/swelling on the left ankle  Will extend antibiotics for another week and check serial inflammation  So labs today and on f/u in 1 week  Will let home health know to continue ceftriaxone 1 gram once a day for 7 more days

## 2019-12-10 NOTE — Telephone Encounter (Signed)
Faxed patient's completed release form with receipt of successful transmission. Gave patient a copy of completed form.   Sandie Ano, RN

## 2019-12-11 LAB — CBC
HCT: 42.4 % (ref 38.5–50.0)
Hemoglobin: 14.2 g/dL (ref 13.2–17.1)
MCH: 26.4 pg — ABNORMAL LOW (ref 27.0–33.0)
MCHC: 33.5 g/dL (ref 32.0–36.0)
MCV: 79 fL — ABNORMAL LOW (ref 80.0–100.0)
MPV: 10 fL (ref 7.5–12.5)
Platelets: 408 10*3/uL — ABNORMAL HIGH (ref 140–400)
RBC: 5.37 10*6/uL (ref 4.20–5.80)
RDW: 13 % (ref 11.0–15.0)
WBC: 10.6 10*3/uL (ref 3.8–10.8)

## 2019-12-11 LAB — COMPREHENSIVE METABOLIC PANEL
AG Ratio: 1.3 (calc) (ref 1.0–2.5)
ALT: 26 U/L (ref 9–46)
AST: 18 U/L (ref 10–40)
Albumin: 4.1 g/dL (ref 3.6–5.1)
Alkaline phosphatase (APISO): 65 U/L (ref 36–130)
BUN: 11 mg/dL (ref 7–25)
CO2: 29 mmol/L (ref 20–32)
Calcium: 9.6 mg/dL (ref 8.6–10.3)
Chloride: 103 mmol/L (ref 98–110)
Creat: 1.01 mg/dL (ref 0.60–1.35)
Globulin: 3.1 g/dL (calc) (ref 1.9–3.7)
Glucose, Bld: 112 mg/dL — ABNORMAL HIGH (ref 65–99)
Potassium: 4.3 mmol/L (ref 3.5–5.3)
Sodium: 138 mmol/L (ref 135–146)
Total Bilirubin: 0.2 mg/dL (ref 0.2–1.2)
Total Protein: 7.2 g/dL (ref 6.1–8.1)

## 2019-12-11 LAB — C-REACTIVE PROTEIN: CRP: 5.7 mg/L (ref <8.0)

## 2019-12-17 ENCOUNTER — Ambulatory Visit (INDEPENDENT_AMBULATORY_CARE_PROVIDER_SITE_OTHER): Payer: PRIVATE HEALTH INSURANCE | Admitting: Internal Medicine

## 2019-12-17 ENCOUNTER — Other Ambulatory Visit: Payer: Self-pay

## 2019-12-17 ENCOUNTER — Encounter: Payer: Self-pay | Admitting: Internal Medicine

## 2019-12-17 ENCOUNTER — Telehealth: Payer: Self-pay

## 2019-12-17 VITALS — BP 146/90 | HR 86 | Temp 98.6°F | Wt 357.0 lb

## 2019-12-17 DIAGNOSIS — A5486 Gonococcal sepsis: Secondary | ICD-10-CM

## 2019-12-17 NOTE — Patient Instructions (Signed)
Stop antibiotics on Sunday; picc can be removed by home health at that time   See me in 3-4 weeks; please do blood and urine test 5-7 days before seeing me   Agree you can rest, ice, elevate and take ibuprofen as needed if there is still discomfort/swelling in the ankle joint   If fever, chill, persistent increased swelling/pain, new joint involvement please call to see [place] sooner

## 2019-12-17 NOTE — Progress Notes (Signed)
Marland Kitchen        [place] for Infectious Disease  Patient Active Problem List   Diagnosis Date Noted  . Joint pain 12/01/2019  . DGI (disseminated gonococcal infection) ([place])   . Polyarthralgia 11/30/2019  . Acute left ankle pain 11/30/2019  . HIV (human immunodeficiency virus infection) ([place])   . Essential hypertension   . Acute appendicitis s/p lap appendectomy 10/31/2017 10/25/2017    Patient's Medications  New Prescriptions   No medications on file  Previous Medications   ACETAMINOPHEN (TYLENOL) 500 MG TABLET    Take 500 mg by mouth every 6 (six) hours as needed.   AMLODIPINE (NORVASC) 10 MG TABLET    Take 10 mg by mouth daily.    ASPIRIN EC 81 MG TABLET    Take 81 mg by mouth daily.    BIKTARVY 50-200-25 MG TABS TABLET    Take 1 tablet by mouth daily.    CHOLECALCIFEROL (VITAMIN D) 1000 UNITS TABLET    Take 1,000 Units by mouth daily.   GARLIC PO    Take 1 capsule by mouth daily.   GUAIFENESIN (MUCINEX) 600 MG 12 HR TABLET    Take 600 mg by mouth daily.   HYDROCODONE-ACETAMINOPHEN (NORCO/VICODIN) 5-325 MG TABLET    Take 1-2 tablets by mouth every 4 (four) hours as needed for moderate pain.   KETOCONAZOLE (NIZORAL) 2 % CREAM    Apply 1 application topically daily. Apply to feet daily   METOPROLOL TARTRATE (LOPRESSOR) 25 MG TABLET    Take 0.5 tablets (12.5 mg total) by mouth 2 (two) times daily.   MULTIPLE VITAMINS-MINERALS (HAIR SKIN AND NAILS FORMULA PO)    Take by mouth.   MULTIPLE VITAMINS-MINERALS (MULTIVITAMIN ADULT PO)    Take 1 tablet by mouth daily.   MULTIPLE VITAMINS-MINERALS (ZINC PO)    Take by mouth.   NON FORMULARY    "Blood pressure support vitamin"   NON FORMULARY    "blood sugar support vitamin"   OMEPRAZOLE (PRILOSEC) 40 MG CAPSULE    Take 40 mg by mouth daily.   ONDANSETRON (ZOFRAN) 8 MG TABLET    Take 8 mg by mouth every 8 (eight) hours as needed for nausea or vomiting.   Modified Medications   No medications on file  Discontinued Medications   No  medications on file    Subjective: Well controlled 29 yo hiv male patient recent admission earlier this month for rash/polyarticular arthritis found to have disseminated gonococcal infection via triple screen, here for f/u  10/29 ?urine gonorrhea positive; there are two other test but all was labeled from urine.   He was placed on ceftriaxone for 2 weeks and f/u here  Improved pain/swelling in ankles. Still some swelling/pain left ankle. Pain mostly on ambulating. Previously couldn't put weight  No washout was done/needed  No f/c No n/v/diarrhea Rash had resolved  No chlamydia infection prevoius syphilis treated; rpr 1:1  No eye pain/redness No penile discharge No penile/genital rash-ulcer  11/18 id f/u Patient started on another week abx 4 days ago, has 3 more days to go Labs reviewed; crp normal No rash, n/v/diarrhea Some swelling/pain end of days still in ankle   Review of Systems: Negative 11 point ros unless mentioned above  Past Medical History:  Diagnosis Date  . HIV (human immunodeficiency virus infection) ([place])   . Hypertension     Social History   Tobacco Use  . Smoking status: Former Smoker    Types: Cigarettes  . Smokeless tobacco:  Never Used  Vaping Use  . Vaping Use: Never used  Substance Use Topics  . Alcohol use: Yes    Comment: socially  . Drug use: Yes    Frequency: 14.0 times per week    Types: Marijuana    Comment: 3-4 times a week    Family History  Problem Relation Age of Onset  . Heart failure Father     Allergies  Allergen Reactions  . Fish Allergy Nausea And Vomiting  . Shellfish Allergy Hives and Nausea And Vomiting    Objective: Vitals:   12/17/19 1537  BP: (!) 146/90  Pulse: 86  Temp: 98.6 F (37 C)  TempSrc: Oral  Weight: (!) 357 lb (161.9 kg)   Body mass index is 51.22 kg/m.  Physical Exam No distress, well developed male, cooperative Normocephalic; per; conj clear; eomi Neck supple cv rrr no  mrg Lungs clear abd s/nt Ext trace swelling left foot msk full rom bilateral ankle; some swelling/tenderness subtalar joint Neuro nonfocal  Labs: Reviewed 11/15 crp 3; esr 21 11/3 cr 0.8; cbc 10/14/392  Gonorrhea urine positive  Micro: Blood cx negative Joint fluid cx negative; 13k wbc  Imaging: Mri left ankle   1. Moderate tibiotalar and posterior subtalar joint effusions with synovial enhancement, concerning for septic arthritis. 2. Infectious flexor hallucis longus tenosynovitis extending from the level of the tibiotalar joint to the master knot of Mallie Mussel. 3. No osteomyelitis or abscess.  Assessment/plan Problem List Items Addressed This Visit      Other   DGI (disseminated gonococcal infection) ([place]) - Primary     Given still moderate sx of pain/swelling left ankle, will extend abx for another week. Joint involvement so will keep on iv ceftriaxone 1 gram iv daily  11/18 id assessment crp normal 1 week off abx on 11/15. Restarted another week abx given significant swelling/pain still and had improved, but perhaps could be just course of healing not related to abx.   -will plan to stop abx after he finished the total 7 more days -test for reinfection and hepatitis in 4 weeks; f/u then  -precaution given for return sooner   Jabier Mutton, MD [facility] for Infectious [place] -- -- pager   [phone] cell 12/17/2019, 4:04 PM

## 2019-12-17 NOTE — Telephone Encounter (Signed)
Spoke with Jimmy Fields at [facility] and verbal orders were given to pull picc on 12/21/19 and patient's last dose on 12/20/19 per Dr. Renold Don. Jimmy Fields verbalized understanding and read orders back. [name] T Pricilla Loveless

## 2020-01-06 ENCOUNTER — Encounter: Payer: Self-pay | Admitting: Internal Medicine

## 2020-01-11 ENCOUNTER — Other Ambulatory Visit: Payer: PRIVATE HEALTH INSURANCE

## 2020-01-12 ENCOUNTER — Other Ambulatory Visit: Payer: PRIVATE HEALTH INSURANCE

## 2020-01-12 ENCOUNTER — Other Ambulatory Visit (HOSPITAL_COMMUNITY)
Admission: RE | Admit: 2020-01-12 | Discharge: 2020-01-12 | Disposition: A | Discharge status: Home / Self Care | Payer: PRIVATE HEALTH INSURANCE | Source: Ambulatory Visit | Attending: Internal Medicine | Admitting: Internal Medicine

## 2020-01-12 ENCOUNTER — Other Ambulatory Visit: Payer: Self-pay

## 2020-01-12 DIAGNOSIS — A5486 Gonococcal sepsis: Secondary | ICD-10-CM | POA: Diagnosis present

## 2020-01-13 LAB — URINE CYTOLOGY ANCILLARY ONLY
Chlamydia: NEGATIVE
Comment: NEGATIVE
Comment: NORMAL
Neisseria Gonorrhea: NEGATIVE

## 2020-01-13 LAB — HEPATITIS C ANTIBODY
Hepatitis C Ab: NONREACTIVE
SIGNAL TO CUT-OFF: 0.05 (ref <1.00)

## 2020-01-13 LAB — HEPATITIS B SURFACE ANTIGEN: Hepatitis B Surface Ag: NONREACTIVE

## 2020-01-13 LAB — C-REACTIVE PROTEIN: CRP: 2.6 mg/L (ref <8.0)

## 2020-01-13 LAB — HEPATITIS B CORE ANTIBODY, TOTAL: Hep B Core Total Ab: NONREACTIVE

## 2020-01-13 LAB — HEPATITIS B SURFACE ANTIBODY,QUALITATIVE: Hep B S Ab: REACTIVE — AB

## 2020-01-20 ENCOUNTER — Encounter: Payer: Self-pay | Admitting: Internal Medicine

## 2020-01-20 ENCOUNTER — Ambulatory Visit (INDEPENDENT_AMBULATORY_CARE_PROVIDER_SITE_OTHER): Payer: PRIVATE HEALTH INSURANCE | Admitting: Internal Medicine

## 2020-01-20 ENCOUNTER — Other Ambulatory Visit (HOSPITAL_COMMUNITY)
Admission: RE | Admit: 2020-01-20 | Discharge: 2020-01-20 | Disposition: A | Discharge status: Home / Self Care | Payer: PRIVATE HEALTH INSURANCE | Source: Ambulatory Visit | Attending: Internal Medicine | Admitting: Internal Medicine

## 2020-01-20 ENCOUNTER — Other Ambulatory Visit: Payer: Self-pay

## 2020-01-20 VITALS — BP 143/95 | HR 80 | Temp 98.0°F | Ht 70.0 in | Wt 350.0 lb

## 2020-01-20 DIAGNOSIS — Z113 Encounter for screening for infections with a predominantly sexual mode of transmission: Secondary | ICD-10-CM | POA: Insufficient documentation

## 2020-01-20 DIAGNOSIS — A5486 Gonococcal sepsis: Secondary | ICD-10-CM | POA: Diagnosis not present

## 2020-01-20 NOTE — Addendum Note (Signed)
Addended by: Harley Alto on: 01/20/2020 04:45 PM   Modules accepted: Orders

## 2020-01-20 NOTE — Patient Instructions (Signed)
We will do triple screen for gc/chlam today, along with syphilis  You had hepatitis b vaccination and good response  Continue to practice safe sex  Please f/u with your HIV care provider for ongoing hiv care

## 2020-01-20 NOTE — Progress Notes (Signed)
Marland Kitchen        [place] for Infectious Disease  Patient Active Problem List   Diagnosis Date Noted  . Joint pain 12/01/2019  . DGI (disseminated gonococcal infection) ([place])   . Polyarthralgia 11/30/2019  . Acute left ankle pain 11/30/2019  . HIV (human immunodeficiency virus infection) ([place])   . Essential hypertension   . Acute appendicitis s/p lap appendectomy 10/31/2017 10/25/2017    Patient's Medications  New Prescriptions   No medications on file  Previous Medications   ACETAMINOPHEN (TYLENOL) 500 MG TABLET    Take 500 mg by mouth every 6 (six) hours as needed.   AMLODIPINE (NORVASC) 10 MG TABLET    Take 10 mg by mouth daily.    ASPIRIN EC 81 MG TABLET    Take 81 mg by mouth daily.   BIKTARVY 50-200-25 MG TABS TABLET    Take 1 tablet by mouth daily.    CHOLECALCIFEROL (VITAMIN D) 1000 UNITS TABLET    Take 1,000 Units by mouth daily.   GARLIC PO    Take 1 capsule by mouth daily.   GUAIFENESIN (MUCINEX) 600 MG 12 HR TABLET    Take 600 mg by mouth daily.   HYDROCODONE-ACETAMINOPHEN (NORCO/VICODIN) 5-325 MG TABLET    Take 1-2 tablets by mouth every 4 (four) hours as needed for moderate pain.   KETOCONAZOLE (NIZORAL) 2 % CREAM    Apply 1 application topically daily. Apply to feet daily   METOPROLOL TARTRATE (LOPRESSOR) 25 MG TABLET    Take 0.5 tablets (12.5 mg total) by mouth 2 (two) times daily.   MULTIPLE VITAMINS-MINERALS (HAIR SKIN AND NAILS FORMULA PO)    Take by mouth.   MULTIPLE VITAMINS-MINERALS (MULTIVITAMIN ADULT PO)    Take 1 tablet by mouth daily.   MULTIPLE VITAMINS-MINERALS (ZINC PO)    Take by mouth.   NON FORMULARY    "Blood pressure support vitamin"   NON FORMULARY    "blood sugar support vitamin"   OMEPRAZOLE (PRILOSEC) 40 MG CAPSULE    Take 40 mg by mouth daily.   ONDANSETRON (ZOFRAN) 8 MG TABLET    Take 8 mg by mouth every 8 (eight) hours as needed for nausea or vomiting.   Modified Medications   No medications on file  Discontinued Medications   No  medications on file    Subjective: Well controlled 29 yo hiv male patient recent admission earlier this month for rash/polyarticular arthritis found to have disseminated gonococcal infection via triple screen, here for f/u  10/29 ?urine gonorrhea positive; there are two other test but all was labeled from urine.   He was placed on ceftriaxone for 2 weeks and f/u here  Improved pain/swelling in ankles. Still some swelling/pain left ankle. Pain mostly on ambulating. 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 He wanted to retest std again. Had it tested recently but that was before resuming intimacy No sx (penile discharge, rash, fever, chill,  joint pain, eye pain, sorethroat)  I reviewed his hepatitis b vaccine response; in 2017 has very good hep b sAb titer  Also prior syphilis (early stage) s/p tx; rpr titer 06/2019 is 1:2    Review of Systems: Other ROS negative  Past Medical History:  Diagnosis Date  . HIV (human immunodeficiency virus infection) ([place])   . Hypertension     Social History   Tobacco Use  . Smoking status: Former Smoker    Types: Cigarettes  . Smokeless tobacco: Never Used  Vaping Use  . Vaping Use: Never used  Substance Use Topics  . Alcohol use: Yes    Comment: socially  . Drug use: Yes    Frequency: 14.0 times per week    Types: Marijuana    Comment: 3-4 times a week    Family History   Problem Relation Age of Onset  . Heart failure Father     Allergies  Allergen Reactions  . Fish Allergy Nausea And Vomiting  . Shellfish Allergy Hives and Nausea And Vomiting    Objective: Vitals:   01/20/20 1555  BP: (!) 143/95  Pulse: 80  Temp: 98 F (36.7 C)  Weight: (!) 350 lb (158.8 kg)  Height: 5' 10"  (1.778 m)   Body mass index is 50.22 kg/m.  Physical Exam No distress, well developed male, cooperative Normocephalic; per; conj clear; eomi Neck supple cv rrr no mrg Lungs clear abd s/nt Ext trace swelling left foot msk nontender left ankle subtalar joint; full ankle rom Neuro nonfocal  Labs: Reviewed 11/15 crp 3; esr 21 11/3 cr 0.8; cbc 10/14/392  Serology: 12/14 hep b sAb reactive, sAg, core ab negative; hep c ab negative; gc/chlam urine NAAT negative   Micro: Blood cx negative Joint fluid cx negative; 13k wbc  Imaging: Mri left ankle   1. Moderate tibiotalar and posterior subtalar joint effusions with synovial enhancement, concerning for septic arthritis. 2. Infectious flexor hallucis longus tenosynovitis extending from the level of the tibiotalar joint to the master knot of Mallie Mussel. 3. No osteomyelitis or abscess.  Assessment/plan Problem List Items Addressed This Visit      Other   DGI (disseminated gonococcal infection) ([place])   Relevant Orders   RPR   GC/Chlamydia Probe Amp   GC/CT Probe, Amp (Throat)   GC/Chlamydia Probe Amp    Other Visit Diagnoses    Screening for STDs (sexually transmitted diseases)    -  Primary   Relevant Orders   RPR   GC/Chlamydia Probe Amp   GC/CT Probe, Amp (Throat)   GC/Chlamydia Probe Amp     #dgi Seems treated Will need test of cure; some confounding as recent sexual exposure again  -triple screen today; patient self collect  #screen for std It appears patient prior vaccinated and responded well to hep b  -serum rpr  #HIV He prefers to f/u [facility] for ongoing hiv care   Jabier Mutton,  [place] for Infectious Disease [place] -- -- pager   [phone] cell 01/20/2020, 4:03 PM

## 2020-01-21 LAB — CYTOLOGY, (ORAL, ANAL, URETHRAL) ANCILLARY ONLY
Chlamydia: NEGATIVE
Chlamydia: NEGATIVE
Comment: NEGATIVE
Comment: NEGATIVE
Comment: NORMAL
Comment: NORMAL
Neisseria Gonorrhea: NEGATIVE
Neisseria Gonorrhea: POSITIVE — AB

## 2020-01-21 LAB — URINE CYTOLOGY ANCILLARY ONLY
Chlamydia: NEGATIVE
Comment: NEGATIVE
Comment: NORMAL
Neisseria Gonorrhea: NEGATIVE

## 2020-01-21 LAB — FLUORESCENT TREPONEMAL AB(FTA)-IGG-BLD: Fluorescent Treponemal ABS: REACTIVE — AB

## 2020-01-21 LAB — RPR: RPR Ser Ql: REACTIVE — AB

## 2020-01-21 LAB — RPR TITER: RPR Titer: 1:1 {titer} — ABNORMAL HIGH

## 2020-02-01 ENCOUNTER — Telehealth: Payer: Self-pay | Admitting: *Deleted

## 2020-02-01 NOTE — Telephone Encounter (Signed)
RN left message asking Jimmy Fields to call his doctor's office regarding labs. Asked for a call back this week, preferably today, to schedule. Do you want to treat with ceftriaxone 500 mg IM once? Andree Coss, RN

## 2020-02-01 NOTE — Telephone Encounter (Signed)
-----   Message from Raymondo Band, MD sent at 01/29/2020 10:27 AM EST ----- Please have patient seennin clinic this or next week for gonorrhea reinfection and treatment  Thanks team

## 2020-02-02 NOTE — Telephone Encounter (Signed)
Patient coming 1/6 2:00 for ceftriaxone injection per Dr Renold Don. Andree Coss, RN

## 2020-02-04 ENCOUNTER — Ambulatory Visit (INDEPENDENT_AMBULATORY_CARE_PROVIDER_SITE_OTHER): Payer: PRIVATE HEALTH INSURANCE

## 2020-02-04 ENCOUNTER — Other Ambulatory Visit: Payer: Self-pay

## 2020-02-04 DIAGNOSIS — A549 Gonococcal infection, unspecified: Secondary | ICD-10-CM

## 2020-02-04 MED ORDER — CEFTRIAXONE SODIUM 500 MG IJ SOLR
500.0000 mg | Freq: Once | INTRAMUSCULAR | Status: AC
  Administered 2020-02-04: 500 mg via INTRAMUSCULAR

## 2020-02-04 NOTE — Progress Notes (Signed)
Patient tolerated ceftriaxone injection well. Reinforced abstinence for 10 days after treatment, gave condoms and encouraged use. Patient verbalized understanding.   Sandie Ano, RN

## 2020-02-05 ENCOUNTER — Ambulatory Visit: Payer: PRIVATE HEALTH INSURANCE

## 2020-02-08 NOTE — Telephone Encounter (Signed)
Spoke with patient to schedule follow up with Dr. Renold Don for 02/09/20. Patient states he will continue his HIV care with [facility], but that he would like for our office to finish evaluating him as far as the gonorrhea infection goes. RN relayed to patient that Dr. Renold Don does not believe he needs ongoing IV antibiotic treatment for a previous disseminated gonorrhea infection. Patient verbalized understanding and has no further questions.   Sandie Ano, RN

## 2020-02-09 ENCOUNTER — Ambulatory Visit: Payer: PRIVATE HEALTH INSURANCE | Admitting: Internal Medicine

## 2020-02-16 ENCOUNTER — Telehealth (INDEPENDENT_AMBULATORY_CARE_PROVIDER_SITE_OTHER): Payer: PRIVATE HEALTH INSURANCE | Admitting: Internal Medicine

## 2020-02-16 ENCOUNTER — Encounter: Payer: Self-pay | Admitting: Internal Medicine

## 2020-02-16 DIAGNOSIS — A5486 Gonococcal sepsis: Secondary | ICD-10-CM

## 2020-02-16 DIAGNOSIS — Z8619 Personal history of other infectious and parasitic diseases: Secondary | ICD-10-CM

## 2020-02-16 DIAGNOSIS — B20 Human immunodeficiency virus [HIV] disease: Secondary | ICD-10-CM | POA: Diagnosis not present

## 2020-02-16 NOTE — Progress Notes (Signed)
.     I discussed the limitations of evaluation and management by telemedicine and the availability of in person appointments. The patietn expressed understanding and agreed to proceed.   Time spent: 20   Patient location: home   Provider location: office   Type of visit (video vs phone): phone    [facility]  Patient Active Problem List   Diagnosis Date Noted  . Joint pain 12/01/2019  . DGI (disseminated gonococcal infection) ([place])   . Polyarthralgia 11/30/2019  . Acute left ankle pain 11/30/2019  . HIV (human immunodeficiency virus infection) ([place])   . Essential hypertension   . Acute appendicitis s/p lap appendectomy 10/31/2017 10/25/2017    Patient's Medications  New Prescriptions   No medications on file  Previous Medications   ACETAMINOPHEN (TYLENOL) 500 MG TABLET    Take 500 mg by mouth every 6 (six) hours as needed.   AMLODIPINE (NORVASC) 10 MG TABLET    Take 10 mg by mouth daily.    ASPIRIN EC 81 MG TABLET    Take 81 mg by mouth daily.   BIKTARVY 50-200-25 MG TABS TABLET    Take 1 tablet by mouth daily.    CHOLECALCIFEROL (VITAMIN D) 1000 UNITS TABLET    Take 1,000 Units by mouth daily.   GARLIC PO    Take 1 capsule by mouth daily.   GUAIFENESIN (MUCINEX) 600 MG 12 HR TABLET    Take 600 mg by mouth daily.   HYDROCODONE-ACETAMINOPHEN (NORCO/VICODIN) 5-325 MG TABLET    Take 1-2 tablets by mouth every 4 (four) hours as needed for moderate pain.   KETOCONAZOLE (NIZORAL) 2 % CREAM    Apply 1 application topically daily. Apply to feet daily   METOPROLOL TARTRATE (LOPRESSOR) 25 MG TABLET    Take 0.5 tablets (12.5 mg total) by mouth 2 (two) times daily.   MULTIPLE VITAMINS-MINERALS (HAIR SKIN AND NAILS FORMULA PO)    Take by mouth.   MULTIPLE VITAMINS-MINERALS (MULTIVITAMIN ADULT PO)    Take 1 tablet by mouth daily.   MULTIPLE VITAMINS-MINERALS (ZINC PO)    Take by mouth.   NON FORMULARY    "Blood pressure support vitamin"   NON FORMULARY     "blood sugar support vitamin"   OMEPRAZOLE (PRILOSEC) 40 MG CAPSULE    Take 40 mg by mouth daily.   ONDANSETRON (ZOFRAN) 8 MG TABLET    Take 8 mg by mouth every 8 (eight) hours as needed for nausea or vomiting.   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 He got another im shot ceftriaxone on 1/6. His rpr titer remains same 1:1 as on 11/30/2019. He had previous syphilis treatment prior to seeing me. He had repeated sexual exposure. He reports the issue for follow up today is the intermittent mild aching still in the left ankle. He is able to do normal activities. No rash or other joint sx. He previously reports fatigue with our nursing team prompting this visit, but today he has no other complaint. He reports potentially coming down with a cold or uri syndrome with dry cough No sob,  fever, chill, rash, headache, eye pain, back pain, dysuria, flank pain    background -----------  10/29 ?urine gonorrhea positive; there are two other test but all was labeled from urine.   He was placed on ceftriaxone for 2 weeks and f/u here  Improved pain/swelling in ankles. Still some swelling/pain left ankle. Pain mostly on ambulating. Previously couldn't put weight  No washout was done/needed  No f/c No n/v/diarrhea Rash had resolved  No chlamydia infection prevoius syphilis treated; rpr 1:1  No eye pain/redness No penile discharge No penile/genital rash-ulcer  11/18 id f/u Patient started on another week abx 4 days ago, has 3 more days to go Labs reviewed; crp normal No rash, n/v/diarrhea Some  swelling/pain end of days still in ankle  12/22 id f/u Patient finished iv abx 3-4 weeks ago No longer have pain/swelling in the affected left ankle Has been in a relationship with boyfriend and had resumed intimacy 2 weeks ago. He wanted to retest std again. Had it tested recently but that was before resuming intimacy No sx (penile discharge, rash, fever, chill, joint pain, eye pain, sorethroat)  I reviewed his hepatitis b vaccine response; in 2017 has very good hep b sAb titer  Also prior syphilis (early stage) s/p tx; rpr titer 06/2019 is 1:2    Review of Systems: Other ROS negative  Past Medical History:  Diagnosis Date  . HIV (human immunodeficiency virus infection) ([place])   . Hypertension     Social History   Tobacco Use  . Smoking status: Former Smoker    Types: Cigarettes  . Smokeless tobacco: Never Used  Vaping Use  . Vaping Use: Never used  Substance Use Topics  . Alcohol use: Yes    Comment: socially  . Drug use: Yes    Frequency: 14.0 times per week    Types: Marijuana    Comment: 3-4 times a week    Family History  Problem Relation Age of Onset  . Heart failure Father     Allergies  Allergen Reactions  . Fish Allergy Nausea And Vomiting  . Shellfish Allergy Hives and Nausea And Vomiting    Objective: There were no vitals filed for this visit. There is no height or weight on file to calculate BMI.  Physical Exam Unable to do as this is a phone visit on 02/16/20    Labs: Reviewed 11/15 crp 3; esr 21 11/3 cr 0.8; cbc 10/14/392  Serology: 12/22 repeat triple screen gc/chlam positive in anal swab sample 12/22 rpr 1:1 12/14 hep b sAb reactive, sAg, core ab negative; hep c ab negative; gc/chlam urine NAAT negative   Micro: Blood cx negative Joint fluid cx negative; 13k wbc  Imaging: Mri left ankle   1. Moderate tibiotalar and posterior subtalar joint effusions with synovial enhancement, concerning for septic arthritis. 2. Infectious  flexor hallucis longus tenosynovitis extending from the level of the tibiotalar joint to the master knot of Mallie Mussel. 3. No osteomyelitis or abscess.  Assessment/plan Problem List Items Addressed This Visit   None    #dgi Treated well with more than 2 weeks ceftriaxone. Test of cure was positive in anal swab so received another 1 shot ceftriaxone.  Intermittent left ankle pain where originally involved, likely normal evolution of recovery. Overall not worse and no persistent sx or swelling. Advise him to continue monitoring. I do not think there is active infectious gonorrhea within that joint -he is to f/u with his [facility] care team for ongoing hiv care and  repeat triple gc/chlam screen along with other std screening -I advise him if joint sx worse/persistence or associated with swelling, fever, chill, please call us   #HIV #hx syphilis Hx syphilis tx in the past; stable titer 1:1 November and December 2021; would need to have repeat in about 3 months to reassess, he does have ongoing active risk He prefers to f/u [facility] for ongoing hiv care   Jabier Mutton, [facility] -- -- pager   [phone] cell 02/16/2020, 4:33 PM

## 2020-03-10 ENCOUNTER — Telehealth: Payer: Self-pay

## 2020-03-10 NOTE — Telephone Encounter (Signed)
Received voicemail from patient requesting call back for STD testing. RN spoke with patient, he does not have any symptoms of an STD and says he has been having protected sex with his partner, but that he is suspicious and just wants to be extra careful. Per chart, patient is transferring HIV care to [facility]. Patient states he left them a voicemail last week to get back into care with them. RN advised patient to reach out to [facility] again and to contact the health department for STD testing. Patient verbalized understanding and has no further questions.   Sandie Ano, RN

## 2020-06-20 ENCOUNTER — Emergency Department (HOSPITAL_COMMUNITY)
Admission: EM | Admit: 2020-06-20 | Discharge: 2020-06-20 | Disposition: A | Discharge status: Home / Self Care | Payer: No Typology Code available for payment source | Attending: Emergency Medicine | Admitting: Emergency Medicine

## 2020-06-20 DIAGNOSIS — Z21 Asymptomatic human immunodeficiency virus [HIV] infection status: Secondary | ICD-10-CM | POA: Diagnosis not present

## 2020-06-20 DIAGNOSIS — R202 Paresthesia of skin: Secondary | ICD-10-CM | POA: Diagnosis not present

## 2020-06-20 DIAGNOSIS — Z113 Encounter for screening for infections with a predominantly sexual mode of transmission: Secondary | ICD-10-CM | POA: Insufficient documentation

## 2020-06-20 DIAGNOSIS — I10 Essential (primary) hypertension: Secondary | ICD-10-CM | POA: Insufficient documentation

## 2020-06-20 DIAGNOSIS — Z79899 Other long term (current) drug therapy: Secondary | ICD-10-CM | POA: Diagnosis not present

## 2020-06-20 DIAGNOSIS — Z87891 Personal history of nicotine dependence: Secondary | ICD-10-CM | POA: Insufficient documentation

## 2020-06-20 DIAGNOSIS — Z7982 Long term (current) use of aspirin: Secondary | ICD-10-CM | POA: Insufficient documentation

## 2020-06-20 DIAGNOSIS — Z711 Person with feared health complaint in whom no diagnosis is made: Secondary | ICD-10-CM

## 2020-06-20 LAB — CBG MONITORING, ED: Glucose-Capillary: 106 mg/dL — ABNORMAL HIGH (ref 70–99)

## 2020-06-20 MED ORDER — SODIUM CHLORIDE 0.9 % IV SOLN
1.0000 g | Freq: Once | INTRAVENOUS | Status: AC
  Administered 2020-06-20: 1 g via INTRAVENOUS
  Filled 2020-06-20: qty 10

## 2020-06-20 NOTE — ED Provider Notes (Signed)
[facility]-EMERGENCY DEPT Provider Note: Lowella Dell, MD, FACEP  CSN: [id number] MRN: [id number] ARRIVAL: 06/20/20 at 0229 ROOM: WA03/WA03   CHIEF COMPLAINT  Foot Pain   HISTORY OF PRESENT ILLNESS  06/20/20 5:39 AM Jimmy Fields is a 30 y.o. male with HIV infection currently on Biktarvy with a healthy CD4 level.  He has a history of disseminated gonococcal infection for which he was hospitalized for about a month.  He has been having some right foot paresthesias and discomfort (but not frank pain) for the past 2 to 3 days.  He rates this discomfort as a 3 out of 10.  He states the symptoms are similar to what he developed when he had disseminated gonorrhea.  He denies any urethral discharge, rectal discharge or pharyngeal irritation.  He has not had a fever or generalized body symptoms.   Past Medical History:  Diagnosis Date  . HIV (human immunodeficiency virus infection) ([facility])   . Hypertension     Past Surgical History:  Procedure Laterality Date  . LAPAROSCOPIC APPENDECTOMY N/A 10/31/2017   Procedure: APPENDECTOMY LAPAROSCOPIC;  Surgeon: Glenna Fellows, MD;  Location: [facility] ORS;  Service: General;  Laterality: N/A;    Family History  Problem Relation Age of Onset  . Heart failure Father     Social History   Tobacco Use  . Smoking status: Former Smoker    Types: Cigarettes  . Smokeless tobacco: Never Used  Vaping Use  . Vaping Use: Never used  Substance Use Topics  . Alcohol use: Yes    Comment: socially  . Drug use: Yes    Frequency: 14.0 times per week    Types: Marijuana    Comment: 3-4 times a week    Prior to Admission medications   Medication Sig Start Date End Date Taking? Authorizing Provider  acetaminophen (TYLENOL) 500 MG tablet Take 500 mg by mouth every 6 (six) hours as needed.    [provider]  amLODipine (NORVASC) 10 MG tablet Take 10 mg by mouth daily.  10/04/17   [provider]  aspirin EC 81 MG tablet Take 81 mg by mouth daily.     [provider]  BIKTARVY 50-200-25 MG TABS tablet Take 1 tablet by mouth daily.  09/29/17   [provider]  cholecalciferol (VITAMIN D) 1000 units tablet Take 1,000 Units by mouth daily.    [provider]  GARLIC PO Take 1 capsule by mouth daily.    [provider]  guaiFENesin (MUCINEX) 600 MG 12 hr tablet Take 600 mg by mouth daily.    [provider]  HYDROcodone-acetaminophen (NORCO/VICODIN) 5-325 MG tablet Take 1-2 tablets by mouth every 4 (four) hours as needed for moderate pain. 12/05/19   Alwyn Ren, MD  ketoconazole (NIZORAL) 2 % cream Apply 1 application topically daily. Apply to feet daily 10/16/19   [provider]  metoprolol tartrate (LOPRESSOR) 25 MG tablet Take 0.5 tablets (12.5 mg total) by mouth 2 (two) times daily. 12/05/19   Alwyn Ren, MD  Multiple Vitamins-Minerals (HAIR SKIN AND NAILS FORMULA PO) Take by mouth.    [provider]  Multiple Vitamins-Minerals (MULTIVITAMIN ADULT PO) Take 1 tablet by mouth daily.    [provider]  Multiple Vitamins-Minerals (ZINC PO) Take by mouth.    [provider]  NON FORMULARY "Blood pressure support vitamin"    [provider]  NON FORMULARY "blood sugar support vitamin"    [provider]  omeprazole (PRILOSEC) 40  MG capsule Take 40 mg by mouth daily.    [provider]  ondansetron (ZOFRAN) 8 MG tablet Take 8 mg by mouth every 8 (eight) hours as needed for nausea or vomiting.  10/04/17   [provider]    Allergies Fish allergy and Shellfish allergy   REVIEW OF SYSTEMS  Negative except as noted here or in the History of Present Illness.   PHYSICAL EXAMINATION  Initial Vital Signs Blood pressure (!) 170/101, pulse 69, temperature 98.9 F (37.2 C), temperature source Oral, resp. rate 18, height 5\' 11"  (1.803 m), weight (!) 158.8 kg, SpO2 98 %.  Examination General: Well-developed, high BMI  male in no acute distress; appearance consistent with age of record HENT: normocephalic; atraumatic Eyes: pupils equal, round and reactive to light; extraocular muscles intact Neck: supple Heart: regular rate and rhythm Lungs: clear to auscultation bilaterally Abdomen: soft; nondistended; nontender; bowel sounds present Extremities: No deformity; full range of motion; pulses normal; subjectively altered sensation in right foot without frank tenderness, erythema or warmth Neurologic: Awake, alert and oriented; motor function intact in all extremities and symmetric; no facial droop Skin: Warm and dry Psychiatric: Normal mood and affect   RESULTS  Summary of this visit's results, reviewed and interpreted by myself:   EKG Interpretation  Date/Time:    Ventricular Rate:    PR Interval:    QRS Duration:   QT Interval:    QTC Calculation:   R Axis:     Text Interpretation:        Laboratory Studies: Results for orders placed or performed during the hospital encounter of 06/20/20 (from the past 24 hour(s))  CBG monitoring, ED     Status: Abnormal   Collection Time: 06/20/20  5:56 AM  Result Value Ref Range   Glucose-Capillary 106 (H) 70 - 99 mg/dL   Imaging Studies: No results found.  ED COURSE and MDM  Nursing notes, initial and subsequent vitals signs, including pulse oximetry, reviewed and interpreted by myself.  Vitals:   06/20/20 0250 06/20/20 0251 06/20/20 0430  BP:  (!) 174/107 (!) 170/101  Pulse:  77 69  Resp:  18 18  Temp:  98.9 F (37.2 C)   TempSrc:  Oral   SpO2:  97% 98%  Weight: (!) 158.8 kg    Height: 5\' 11"  (1.803 m)     Medications  cefTRIAXone (ROCEPHIN) 1 g in sodium chloride 0.9 % 100 mL IVPB (has no administration in time range)    I have a low index of suspicion for disseminated gonorrhea but we will test him for gonorrhea and give him a dose of Rocephin.  He was advised to then follow-up with his infectious disease doctor at [facility] for further  evaluation or to return if symptoms are worsening.  He has been told he was prediabetic in the past but his A1c most recently was 6.0.  He was concerned about the possibility of diabetic neuropathy.  His sugar here is 106 and given that his A1c is still so low I have a low suspicion of diabetic neuropathy.  PROCEDURES  Procedures   ED DIAGNOSES     ICD-10-CM   1. Paresthesia of right foot  R20.2   2. Concern about sexually transmitted disease in male without diagnosis  Z71.1        Keandrea Tapley, MD 06/20/20 [phone]

## 2020-06-20 NOTE — ED Triage Notes (Signed)
Pt c/o R foot pain and discomfort x 2-3 days and reports it has felt numb at times. States something like this happened before and he had an infection in his foot. No wounds noted. Pt denies fevers.

## 2020-06-21 LAB — GC/CHLAMYDIA PROBE AMP (~~LOC~~) NOT AT ARMC
Chlamydia: NEGATIVE
Comment: NEGATIVE
Comment: NORMAL
Neisseria Gonorrhea: NEGATIVE

## 2020-06-26 ENCOUNTER — Emergency Department (HOSPITAL_COMMUNITY)
Admission: EM | Admit: 2020-06-26 | Discharge: 2020-06-26 | Disposition: A | Discharge status: Home / Self Care | Payer: No Typology Code available for payment source | Attending: Emergency Medicine | Admitting: Emergency Medicine

## 2020-06-26 ENCOUNTER — Other Ambulatory Visit: Payer: Self-pay

## 2020-06-26 DIAGNOSIS — Z79899 Other long term (current) drug therapy: Secondary | ICD-10-CM | POA: Insufficient documentation

## 2020-06-26 DIAGNOSIS — I10 Essential (primary) hypertension: Secondary | ICD-10-CM | POA: Diagnosis not present

## 2020-06-26 DIAGNOSIS — Z87891 Personal history of nicotine dependence: Secondary | ICD-10-CM | POA: Diagnosis not present

## 2020-06-26 DIAGNOSIS — M79671 Pain in right foot: Secondary | ICD-10-CM | POA: Insufficient documentation

## 2020-06-26 DIAGNOSIS — Z21 Asymptomatic human immunodeficiency virus [HIV] infection status: Secondary | ICD-10-CM | POA: Diagnosis not present

## 2020-06-26 DIAGNOSIS — Z7982 Long term (current) use of aspirin: Secondary | ICD-10-CM | POA: Insufficient documentation

## 2020-06-26 NOTE — Discharge Instructions (Signed)
Wear supportive footwear at all times.  Please follow-up with your podiatrist and neurologist.  Please return for worsening swelling redness or if you develop a fever.

## 2020-06-26 NOTE — ED Provider Notes (Signed)
[facility]-EMERGENCY DEPT Provider Note   CSN: [id number] Arrival date & time: 06/26/20  0253     History Chief Complaint  Patient presents with  . Foot Pain    Jimmy Fields is a 30 y.o. male.  30 yo M with a chief complaints of right plantar foot pain.  This is been going on for about a week or so.  He has been seen in the ED for this previously.  He unfortunately has had disseminated gonorrhea and had an infection of the right ankle joints and was treated in the hospital with IV antibiotics.  He is concerned because it is on the same side that he had that infection.  When he was recently seen in the ED he was tested and treated for gonorrhea.  He feels like it has not improved at all and feels like the swelling has moved from his bottom of his foot up to his ankle.  He did try and do a home treatment to his foot and actually shaved the outermost skin layer of the bottom of his foot off.  He is wondering if maybe this is the cause of his symptoms as well.  Denies fevers or chills.  Denies trauma.  The history is provided by the patient.  Foot Pain This is a new problem. The current episode started yesterday. The problem occurs constantly. The problem has not changed since onset.Pertinent negatives include no chest pain, no abdominal pain, no headaches and no shortness of breath. Nothing aggravates the symptoms. Nothing relieves the symptoms. He has tried nothing for the symptoms. The treatment provided no relief.       Past Medical History:  Diagnosis Date  . HIV (human immunodeficiency virus infection) ([facility])   . Hypertension     Patient Active Problem List   Diagnosis Date Noted  . Joint pain 12/01/2019  . DGI (disseminated gonococcal infection) ([facility])   . Polyarthralgia 11/30/2019  . Acute left ankle pain 11/30/2019  . HIV (human immunodeficiency virus infection) ([facility])   . Essential hypertension   . Acute appendicitis s/p lap appendectomy 10/31/2017  10/25/2017    Past Surgical History:  Procedure Laterality Date  . LAPAROSCOPIC APPENDECTOMY N/A 10/31/2017   Procedure: APPENDECTOMY LAPAROSCOPIC;  Surgeon: Glenna Fellows, MD;  Location: [facility];  Service: General;  Laterality: N/A;       Family History  Problem Relation Age of Onset  . Heart failure Father     Social History   Tobacco Use  . Smoking status: Former Smoker    Types: Cigarettes  . Smokeless tobacco: Never Used  Vaping Use  . Vaping Use: Never used  Substance Use Topics  . Alcohol use: Yes    Comment: socially  . Drug use: Yes    Frequency: 14.0 times per week    Types: Marijuana    Comment: 3-4 times a week    Home Medications Prior to Admission medications   Medication Sig Start Date End Date Taking? Authorizing Provider  acetaminophen (TYLENOL) 500 MG tablet Take 500 mg by mouth every 6 (six) hours as needed.    [provider]  amLODipine (NORVASC) 10 MG tablet Take 10 mg by mouth daily.  10/04/17   [provider]  aspirin EC 81 MG tablet Take 81 mg by mouth daily.    [provider]  BIKTARVY 50-200-25 MG TABS tablet Take 1 tablet by mouth daily.  09/29/17   [provider]  cholecalciferol (VITAMIN D) 1000 units  tablet Take 1,000 Units by mouth daily.    [provider]  GARLIC PO Take 1 capsule by mouth daily.    [provider]  guaiFENesin (MUCINEX) 600 MG 12 hr tablet Take 600 mg by mouth daily.    [provider]  HYDROcodone-acetaminophen (NORCO/VICODIN) 5-325 MG tablet Take 1-2 tablets by mouth every 4 (four) hours as needed for moderate pain. 12/05/19   Alwyn Ren, MD  ketoconazole (NIZORAL) 2 % cream Apply 1 application topically daily. Apply to feet daily 10/16/19   [provider]  metoprolol tartrate (LOPRESSOR) 25 MG tablet Take 0.5 tablets (12.5 mg total) by mouth 2 (two) times daily. 12/05/19   Alwyn Ren, MD  Multiple Vitamins-Minerals (HAIR SKIN  AND NAILS FORMULA PO) Take by mouth.    [provider]  Multiple Vitamins-Minerals (MULTIVITAMIN ADULT PO) Take 1 tablet by mouth daily.    [provider]  Multiple Vitamins-Minerals (ZINC PO) Take by mouth.    [provider]  NON FORMULARY "Blood pressure support vitamin"    [provider]  NON FORMULARY "blood sugar support vitamin"    [provider]  omeprazole (PRILOSEC) 40 MG capsule Take 40 mg by mouth daily.    [provider]  ondansetron (ZOFRAN) 8 MG tablet Take 8 mg by mouth every 8 (eight) hours as needed for nausea or vomiting.  10/04/17   [provider]    Allergies    Fish allergy and Shellfish allergy  Review of Systems   Review of Systems  Constitutional: Negative for chills and fever.  HENT: Negative for congestion and facial swelling.   Eyes: Negative for discharge and visual disturbance.  Respiratory: Negative for shortness of breath.   Cardiovascular: Negative for chest pain and palpitations.  Gastrointestinal: Negative for abdominal pain, diarrhea and vomiting.  Musculoskeletal: Positive for arthralgias and myalgias.  Skin: Negative for color change and rash.  Neurological: Negative for tremors, syncope and headaches.  Psychiatric/Behavioral: Negative for confusion and dysphoric mood.    Physical Exam Updated Vital Signs BP 129/87 (BP Location: Left Arm)   Pulse 82   Temp 98.2 F (36.8 C) (Oral)   Resp 18   Ht 5\' 11"  (1.803 m)   Wt (!) 158.8 kg   SpO2 96%   BMI 48.82 kg/m   Physical Exam Vitals and nursing note reviewed.  Constitutional:      Appearance: He is well-developed.  HENT:     Head: Normocephalic and atraumatic.  Eyes:     Pupils: Pupils are equal, round, and reactive to light.  Neck:     Vascular: No JVD.  Cardiovascular:     Rate and Rhythm: Normal rate and regular rhythm.     Heart sounds: No murmur heard. No friction rub. No gallop.   Pulmonary:     Effort: No  respiratory distress.     Breath sounds: No wheezing.  Abdominal:     General: There is no distension.     Tenderness: There is no guarding or rebound.  Musculoskeletal:        General: Swelling present. Normal range of motion.     Cervical back: Normal range of motion and neck supple.     Comments: Trace swelling to the right ankle without any pain with range of motion of the ankle.  Pulse motor and sensation are intact distally.  He has some mild pain along the plantar aspect of the foot without erythema or induration.  Skin:  Coloration: Skin is not pale.     Findings: No rash.  Neurological:     Mental Status: He is alert and oriented to person, place, and time.  Psychiatric:        Behavior: Behavior normal.     ED Results / Procedures / Treatments   Labs (all labs ordered are listed, but only abnormal results are displayed) Labs Reviewed - No data to display  EKG None  Radiology No results found.  Procedures Procedures   Medications Ordered in ED Medications - No data to display  ED Course  I have reviewed the triage vital signs and the nursing notes.  Pertinent labs & imaging results that were available during my care of the patient were reviewed by me and considered in my medical decision making (see chart for details).    MDM Rules/Calculators/A&P                          30 yo M with a significant past medical history of HIV and disseminated gonorrhea comes in with a chief complaints of right plantar foot pain.  By history and exam patient symptoms are most likely [place] fasciitis or neuropathy.  Suggested that he follow-up with his podiatrist and neurologist.  We will have him wear supportive footwear as he came in here in sandals.  I think it is less likely to be disseminated gonorrhea, he recently tested negative and was also treated without any improvement of his symptoms.  4:41 AM:  I have discussed the diagnosis/risks/treatment options with the  patient and believe the pt to be eligible for discharge home to follow-up with PCP, podiatry, neuro. We also discussed returning to the ED immediately if new or worsening sx occur. We discussed the sx which are most concerning (e.g., sudden worsening pain, fever, inability to tolerate by mouth) that necessitate immediate return. Medications administered to the patient during their visit and any new prescriptions provided to the patient are listed below.  Medications given during this visit Medications - No data to display   The patient appears reasonably screen and/or stabilized for discharge and I doubt any other medical condition or other [facility] requiring further screening, evaluation, or treatment in the ED at this time prior to discharge.   Final Clinical Impression(s) / ED Diagnoses Final diagnoses:  Right foot pain    Rx / DC Orders ED Discharge Orders    None       Melene Plan, DO 06/26/20 [phone]

## 2020-06-26 NOTE — ED Triage Notes (Signed)
Pt came in with c/o R foot pain. Pt was seen here for same on the 23rd, followed up with his PCP on Friday. Pt states that he has numbness and pain to plantar portion of foot. Pt is diabetic and has hx of disseminated gonorrhea

## 2021-10-04 ENCOUNTER — Encounter (HOSPITAL_COMMUNITY): Payer: Self-pay | Admitting: *Deleted

## 2021-10-04 ENCOUNTER — Emergency Department (HOSPITAL_COMMUNITY)
Admission: EM | Admit: 2021-10-04 | Discharge: 2021-10-04 | Disposition: A | Discharge status: Home / Self Care | Payer: BC Managed Care – PPO | Attending: Emergency Medicine | Admitting: Emergency Medicine

## 2021-10-04 ENCOUNTER — Emergency Department (HOSPITAL_COMMUNITY): Payer: BC Managed Care – PPO

## 2021-10-04 ENCOUNTER — Other Ambulatory Visit: Payer: Self-pay

## 2021-10-04 DIAGNOSIS — M79644 Pain in right finger(s): Secondary | ICD-10-CM | POA: Diagnosis present

## 2021-10-04 DIAGNOSIS — Z79899 Other long term (current) drug therapy: Secondary | ICD-10-CM | POA: Diagnosis not present

## 2021-10-04 DIAGNOSIS — L03011 Cellulitis of right finger: Secondary | ICD-10-CM | POA: Insufficient documentation

## 2021-10-04 DIAGNOSIS — Z7982 Long term (current) use of aspirin: Secondary | ICD-10-CM | POA: Insufficient documentation

## 2021-10-04 DIAGNOSIS — Z23 Encounter for immunization: Secondary | ICD-10-CM | POA: Diagnosis not present

## 2021-10-04 DIAGNOSIS — M25531 Pain in right wrist: Secondary | ICD-10-CM | POA: Diagnosis not present

## 2021-10-04 HISTORY — DX: Type 2 diabetes mellitus without complications: E11.9

## 2021-10-04 MED ORDER — TETANUS-DIPHTH-ACELL PERTUSSIS 5-2.5-18.5 LF-MCG/0.5 IM SUSY
0.5000 mL | PREFILLED_SYRINGE | Freq: Once | INTRAMUSCULAR | Status: AC
  Administered 2021-10-04: 0.5 mL via INTRAMUSCULAR
  Filled 2021-10-04: qty 0.5

## 2021-10-04 MED ORDER — DOXYCYCLINE HYCLATE 100 MG PO CAPS: 100.0000 mg | ORAL_CAPSULE | Freq: Two times a day (BID) | ORAL | 0 refills | Status: AC

## 2021-10-04 NOTE — Discharge Instructions (Signed)
The x-ray of your wrist today was negative for fracture or dislocation, although there was some swelling noted around the wrist.  This is either related to a sprain or tendinitis.  Keep the brace on your wrist at nearly all times over the next few weeks which should hopefully provide stability and support to encourage faster healing.  You can also take Tylenol or Motrin as needed for pain.  If you are still having pain after a few weeks, you can call the orthopedic referral above for evaluation.  Your finger x-ray was also normal, however on exam it does look a bit infected.  I have sent you an antibiotic that you will take twice daily for 5 days.  If after a day or 2 antibiotics he noticed worsening spread of the redness and infection, please return to the ED.
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
## 2021-10-04 NOTE — ED Triage Notes (Signed)
Rt wrist pain for about a week, hurts more with thumb movement, 2 days ago stuck something under his finger nail on R middle finger. Redness and tender

## 2022-01-24 ENCOUNTER — Other Ambulatory Visit: Payer: Self-pay

## 2022-01-24 ENCOUNTER — Emergency Department (HOSPITAL_BASED_OUTPATIENT_CLINIC_OR_DEPARTMENT_OTHER)
Admission: EM | Admit: 2022-01-24 | Discharge: 2022-01-24 | Disposition: A | Discharge status: Home / Self Care | Payer: BC Managed Care – PPO | Attending: Emergency Medicine | Admitting: Emergency Medicine

## 2022-01-24 ENCOUNTER — Emergency Department (HOSPITAL_BASED_OUTPATIENT_CLINIC_OR_DEPARTMENT_OTHER): Payer: BC Managed Care – PPO

## 2022-01-24 ENCOUNTER — Encounter (HOSPITAL_BASED_OUTPATIENT_CLINIC_OR_DEPARTMENT_OTHER): Payer: Self-pay

## 2022-01-24 DIAGNOSIS — Z20822 Contact with and (suspected) exposure to covid-19: Secondary | ICD-10-CM | POA: Insufficient documentation

## 2022-01-24 DIAGNOSIS — Z7982 Long term (current) use of aspirin: Secondary | ICD-10-CM | POA: Insufficient documentation

## 2022-01-24 DIAGNOSIS — J40 Bronchitis, not specified as acute or chronic: Secondary | ICD-10-CM | POA: Insufficient documentation

## 2022-01-24 DIAGNOSIS — Z21 Asymptomatic human immunodeficiency virus [HIV] infection status: Secondary | ICD-10-CM | POA: Insufficient documentation

## 2022-01-24 DIAGNOSIS — R0602 Shortness of breath: Secondary | ICD-10-CM | POA: Diagnosis present

## 2022-01-24 DIAGNOSIS — R600 Localized edema: Secondary | ICD-10-CM | POA: Diagnosis not present

## 2022-01-24 LAB — CBC WITH DIFFERENTIAL/PLATELET
Abs Immature Granulocytes: 0.09 10*3/uL — ABNORMAL HIGH (ref 0.00–0.07)
Basophils Absolute: 0.1 10*3/uL (ref 0.0–0.1)
Basophils Relative: 0 %
Eosinophils Absolute: 0.5 10*3/uL (ref 0.0–0.5)
Eosinophils Relative: 2 %
HCT: 44.4 % (ref 39.0–52.0)
Hemoglobin: 14.8 g/dL (ref 13.0–17.0)
Immature Granulocytes: 1 %
Lymphocytes Relative: 20 %
Lymphs Abs: 4 10*3/uL (ref 0.7–4.0)
MCH: 26.1 pg (ref 26.0–34.0)
MCHC: 33.3 g/dL (ref 30.0–36.0)
MCV: 78.3 fL — ABNORMAL LOW (ref 80.0–100.0)
Monocytes Absolute: 1.9 10*3/uL — ABNORMAL HIGH (ref 0.1–1.0)
Monocytes Relative: 10 %
Neutro Abs: 13.5 10*3/uL — ABNORMAL HIGH (ref 1.7–7.7)
Neutrophils Relative %: 67 %
Platelets: 362 10*3/uL (ref 150–400)
RBC: 5.67 MIL/uL (ref 4.22–5.81)
RDW: 12.9 % (ref 11.5–15.5)
WBC: 20 10*3/uL — ABNORMAL HIGH (ref 4.0–10.5)
nRBC: 0 % (ref 0.0–0.2)

## 2022-01-24 LAB — BASIC METABOLIC PANEL
Anion gap: 11 (ref 5–15)
BUN: 8 mg/dL (ref 6–20)
CO2: 26 mmol/L (ref 22–32)
Calcium: 8.4 mg/dL — ABNORMAL LOW (ref 8.9–10.3)
Chloride: 97 mmol/L — ABNORMAL LOW (ref 98–111)
Creatinine, Ser: 0.92 mg/dL (ref 0.61–1.24)
GFR, Estimated: >60 mL/min (ref >60)
Glucose, Bld: 170 mg/dL — ABNORMAL HIGH (ref 70–99)
Potassium: 2.8 mmol/L — ABNORMAL LOW (ref 3.5–5.1)
Sodium: 134 mmol/L — ABNORMAL LOW (ref 135–145)

## 2022-01-24 LAB — RESP PANEL BY RT-PCR (RSV, FLU A&B, COVID)  RVPGX2
Influenza A by PCR: NEGATIVE
Influenza B by PCR: NEGATIVE
Resp Syncytial Virus by PCR: NEGATIVE
SARS Coronavirus 2 by RT PCR: NEGATIVE

## 2022-01-24 LAB — D-DIMER, QUANTITATIVE: D-Dimer, Quant: 0.33 ug/mL-FEU (ref 0.00–0.50)

## 2022-01-24 LAB — MAGNESIUM: Magnesium: 1.6 mg/dL — ABNORMAL LOW (ref 1.7–2.4)

## 2022-01-24 LAB — MONONUCLEOSIS SCREEN: Mono Screen: NEGATIVE

## 2022-01-24 LAB — GROUP A STREP BY PCR: Group A Strep by PCR: NOT DETECTED

## 2022-01-24 MED ORDER — POTASSIUM CHLORIDE CRYS ER 20 MEQ PO TBCR
40.0000 meq | EXTENDED_RELEASE_TABLET | Freq: Once | ORAL | Status: AC
  Administered 2022-01-24: 40 meq via ORAL
  Filled 2022-01-24: qty 2

## 2022-01-24 MED ORDER — POTASSIUM CHLORIDE CRYS ER 20 MEQ PO TBCR: 20.0000 meq | EXTENDED_RELEASE_TABLET | Freq: Every day | ORAL | 0 refills | Status: AC

## 2022-01-24 MED ORDER — BENZONATATE 100 MG PO CAPS: 200.0000 mg | ORAL_CAPSULE | Freq: Three times a day (TID) | ORAL | 0 refills | Status: AC | PRN

## 2022-01-24 NOTE — ED Notes (Signed)
D/c paperwork reviewed with pt, including prescriptions. No questions or concerns at time of d/c. Pt ambulatory to ED exit without assistance, on RA.

## 2022-01-24 NOTE — Discharge Instructions (Addendum)
Thank you for letting us take care of you today.  Fortunately, we did not find a pneumonia on your chest x-ray and you tested negative for COVID, influenza, and RSV. Your lab work did show a low potassium level so we gave you potassium in the emergency department prior to discharge.  We did a lab test called a d-dimer to rule out a blood clot and thankfully this was negative as well.  Please take the antibiotic as prescribed by your doctor. I have also given you a 3 day supply of potassium to take in addition to cough medication to help with your symptoms. You may also use your home inhaler as prescribed.  I recommend you follow-up with your primary care provider or infectious disease doctor in the beginning of next week.  Should you develop chest pain, worsening of your breathing, inability to eat or drink, high fevers greater than 103 F, throat swelling, or other concerns, please go to the nearest emergency department for re-evaluation.

## 2022-01-24 NOTE — ED Provider Notes (Signed)
[facility] EMERGENCY DEPARTMENT Provider Note   CSN: [id number] Arrival date & time: 01/24/22  1631     History  Chief Complaint  Patient presents with   URI    Jimmy Fields is a 31 y.o. male c/o 4 days of sore throat, fever (resolved 3 days ago), hoarseness, chest congestion, productive cough, and shortness of breath. States he made PCP aware of symptoms at onset who prescribed Tamiflu which he took with no relief. Has been using an inhaler at home with some relief of sore throat and shortness of breath but it always comes back. Positive sick contacts though unsure what they were diagnosed with. He denies rash, joint pain, joint swelling, difficulty swallowing, vomiting, or chest pain. Pt has a history of HIV but reports he is fully compliant with his medications and has had no new sexual partners in over a year as he is engaged. He was treated for gonorrhea last year but reports he completed this treatment. He denies recent travel, hormone use, recent surgery, history of DVT/PE, leg pain, or leg swelling.      Home Medications Prior to Admission medications   Medication Sig Start Date End Date Taking? Authorizing Provider  potassium chloride SA (KLOR-CON M) 20 MEQ tablet Take 1 tablet (20 mEq total) by mouth daily for 3 days. 01/24/22 01/27/22 Yes Symphani Eckstrom L, PA-C  acetaminophen (TYLENOL) 500 MG tablet Take 500 mg by mouth every 6 (six) hours as needed.    [provider]  amLODipine (NORVASC) 10 MG tablet Take 10 mg by mouth daily.  10/04/17   [provider]  aspirin EC 81 MG tablet Take 81 mg by mouth daily.    [provider]  BIKTARVY 50-200-25 MG TABS tablet Take 1 tablet by mouth daily.  09/29/17   [provider]  cholecalciferol (VITAMIN D) 1000 units tablet Take 1,000 Units by mouth daily.    [provider]  GARLIC PO Take 1 capsule by mouth daily.    [provider]  guaiFENesin (MUCINEX) 600 MG 12 hr  tablet Take 600 mg by mouth daily.    [provider]  HYDROcodone-acetaminophen (NORCO/VICODIN) 5-325 MG tablet Take 1-2 tablets by mouth every 4 (four) hours as needed for moderate pain. 12/05/19   Alwyn RenMathews, Elizabeth G, MD  ketoconazole (NIZORAL) 2 % cream Apply 1 application topically daily. Apply to feet daily 10/16/19   [provider]  metoprolol tartrate (LOPRESSOR) 25 MG tablet Take 0.5 tablets (12.5 mg total) by mouth 2 (two) times daily. 12/05/19   Alwyn RenMathews, Elizabeth G, MD  Multiple Vitamins-Minerals (HAIR SKIN AND NAILS FORMULA PO) Take by mouth.    [provider]  Multiple Vitamins-Minerals (MULTIVITAMIN ADULT PO) Take 1 tablet by mouth daily.    [provider]  Multiple Vitamins-Minerals (ZINC PO) Take by mouth.    [provider]  NON FORMULARY "Blood pressure support vitamin"    [provider]  NON FORMULARY "blood sugar support vitamin"    [provider]  omeprazole (PRILOSEC) 40 MG capsule Take 40 mg by mouth daily.    [provider]  ondansetron (ZOFRAN) 8 MG tablet Take 8 mg by mouth every 8 (eight) hours as needed for nausea or vomiting.  10/04/17   [provider]      Allergies    Fish allergy and Shellfish allergy    Review of Systems   Review of Systems  Constitutional:  Positive for fever. Negative for activity change,  appetite change, chills and diaphoresis.  HENT:  Positive for congestion, rhinorrhea, sore throat and voice change. Negative for ear pain, facial swelling, mouth sores, sinus pressure, sinus pain and trouble swallowing.   Eyes:  Negative for pain and visual disturbance.  Respiratory:  Positive for cough and shortness of breath. Negative for chest tightness and wheezing.   Cardiovascular:  Negative for chest pain, palpitations and leg swelling.  Gastrointestinal:  Negative for abdominal pain, constipation, diarrhea, nausea and vomiting.  Genitourinary:  Negative for  difficulty urinating, dysuria, flank pain and hematuria.  Musculoskeletal:  Negative for arthralgias, back pain, gait problem, joint swelling, myalgias, neck pain and neck stiffness.  Skin:  Negative for color change and rash.  Neurological:  Negative for dizziness, seizures, syncope, weakness, light-headedness and headaches.  Hematological:  Negative for adenopathy.  Psychiatric/Behavioral:  Negative for confusion.   All other systems reviewed and are negative.   Physical Exam Updated Vital Signs BP 122/82   Pulse (!) 110   Temp 99 F (37.2 C) (Oral)   Resp 17   Ht 5\' 11"  (1.803 m)   Wt (!) 165.1 kg   SpO2 97%   BMI 50.77 kg/m  Physical Exam Vitals and nursing note reviewed.  Constitutional:      General: He is not in acute distress.    Appearance: Normal appearance. He is not toxic-appearing.  HENT:     Head: Normocephalic and atraumatic.     Nose: Congestion present.     Right Sinus: No maxillary sinus tenderness or frontal sinus tenderness.     Left Sinus: No maxillary sinus tenderness or frontal sinus tenderness.     Mouth/Throat:     Lips: Pink. No lesions.     Mouth: Mucous membranes are moist. No oral lesions or angioedema.     Dentition: Normal dentition. No gingival swelling, dental abscesses or gum lesions.     Tongue: No lesions.     Pharynx: Uvula midline. Posterior oropharyngeal erythema (severe to posterior pharynx) present. No oropharyngeal exudate or uvula swelling.  Eyes:     General: No scleral icterus.    Extraocular Movements: Extraocular movements intact.     Conjunctiva/sclera: Conjunctivae normal.  Cardiovascular:     Rate and Rhythm: Regular rhythm. Tachycardia present.     Heart sounds: Normal heart sounds. No murmur heard. Pulmonary:     Effort: Pulmonary effort is normal.     Breath sounds: Normal breath sounds. No wheezing, rhonchi or rales.     Comments: Coarse cough Abdominal:     General: Abdomen is flat.     Palpations: Abdomen is  soft.     Tenderness: There is no abdominal tenderness. There is no right CVA tenderness, left CVA tenderness, guarding or rebound.  Musculoskeletal:        General: Normal range of motion.     Cervical back: Normal range of motion and neck supple. No rigidity.     Right lower leg: Edema (mild trace, pt reports baseline) present.     Left lower leg: Edema (mild, trace, pt reports baseline) present.  Lymphadenopathy:     Cervical: No cervical adenopathy.  Skin:    General: Skin is warm and dry.     Capillary Refill: Capillary refill takes less than 2 seconds.  Neurological:     General: No focal deficit present.     Mental Status: He is alert and oriented to person, place, and time.     Cranial Nerves: No cranial nerve deficit.  Motor: No weakness.     Gait: Gait normal.  Psychiatric:        Mood and Affect: Mood normal.        Behavior: Behavior normal.     ED Results / Procedures / Treatments   Labs (all labs ordered are listed, but only abnormal results are displayed) Labs Reviewed  BASIC METABOLIC PANEL - Abnormal; Notable for the following components:      Result Value   Sodium 134 (*)    Potassium 2.8 (*)    Chloride 97 (*)    Glucose, Bld 170 (*)    Calcium 8.4 (*)    All other components within normal limits  CBC WITH DIFFERENTIAL/PLATELET - Abnormal; Notable for the following components:   WBC 20.0 (*)    MCV 78.3 (*)    Neutro Abs 13.5 (*)    Monocytes Absolute 1.9 (*)    Abs Immature Granulocytes 0.09 (*)    All other components within normal limits  MAGNESIUM - Abnormal; Notable for the following components:   Magnesium 1.6 (*)    All other components within normal limits  RESP PANEL BY RT-PCR (RSV, FLU A&B, COVID)  RVPGX2  GROUP A STREP BY PCR  MONONUCLEOSIS SCREEN  D-DIMER, QUANTITATIVE    EKG EKG Interpretation  Date/Time:  Wednesday January 24 2022 20:34:43 EST Ventricular Rate:  102 PR Interval:  124 QRS Duration: 92 QT  Interval:  339 QTC Calculation: 442 R Axis:   -76 Text Interpretation: Sinus tachycardia Left anterior fascicular block Nonspecific T abnormalities, lateral leads Confirmed by Rolan Bucco [phone]) on 01/24/2022 9:17:50 PM Also confirmed by Rolan Bucco [phone]), editor Stetler, Angela [phone])  on 01/25/2022 8:13:21 AM  Radiology No results found.  Procedures None  Medications Ordered in ED Medications  potassium chloride SA (KLOR-CON M) CR tablet 40 mEq (40 mEq Oral Given 01/24/22 2128)    ED Course/ Medical Decision Making/ A&P                           Medical Decision Making Amount and/or Complexity of Data Reviewed Labs: ordered. Decision-making details documented in ED Course. Radiology: ordered. Decision-making details documented in ED Course. ECG/medicine tests: ordered. Decision-making details documented in ED Course.  Risk Prescription drug management.   31 year old male presents to ED with symptoms consistent with bronchitis though unimproving with home inhaler treatments. Pt does have a history of HIV though is compliant with medications and reports last CD4 count normal. Exam remarkable for significant pharyngeal erythema, coarse cough, and tachycardia though lungs clear to auscultation. On my initial exam, pt's viral swabs negative as well as negative strep and CXR. With significant erythema and hoarseness, added mono screen as pt did report he frequently shares drinks with others and also added blood work with pt's history of HIV and significant symptoms. Blood work remarkable for leukocytosis at 20 and potassium of 2.8. No signs of dental, peritonsillar, or retrophayngeal abscess on exam with patent airway, no stridor, clear lungs, and pt having no difficulty swallowing. No risk factors for PE but with HIV history and persistent tachycardia, discussed with attending MD and d-dimer was added for further evaluation. Fortunately, this returned negative. Pt reported known  history of hypokalemia and inability to take his oral potassium at home (which was just started) due to his illness so we replaced this in ED and discharged him home with 3 day supply as well as instruction to follow-up  with PCP. He notes PCP prescribed him antibiotics for his symptoms that he has available to pick up. With his, will add cough medication to his regimen which already includes an inhaler and advise him to follow-up with PCP of infectious disease doctor early the following week. Pt's vital signs remain stable, he shows no signs of respiratory distress, and he is non-toxic appearing. All questions answered prior to discharge. Pt expressed understanding of importance of follow up as well as strict return precautions.          Final Clinical Impression(s) / ED Diagnoses Final diagnoses:  Bronchitis    Rx / DC Orders ED Discharge Orders          Ordered    potassium chloride SA (KLOR-CON M) 20 MEQ tablet  Daily        01/24/22 2301    benzonatate (TESSALON) 100 MG capsule  3 times daily PRN        01/24/22 2301              Tonette Lederer, PA-C 02/01/22 1704    Rolan Bucco, MD 02/03/22 [phone]

## 2022-01-24 NOTE — ED Triage Notes (Signed)
C/o cough, sore throat, chills, pain in chest with coughing, shortness of breath, hoarseness. Been taking tamiflu as prescribed by pcp, was not tested for flu.

## 2022-06-11 IMAGING — MR MR ANKLE*L* WO/W CM
4 of 9 series · 21 of 40 positions shown · IV contrast (gadavist)
Comparison: Left foot x-rays dated November 30, 2019.

CLINICAL DATA: Left ankle pain.  Disseminated gonococcal infection.

EXAM:
MRI OF THE LEFT ANKLE WITHOUT AND WITH CONTRAST
TECHNIQUE: Multiplanar, multisequence MR imaging of the ankle was performed
before and after the administration of intravenous contrast.
CONTRAST:  10mL GADAVIST GADOBUTROL 1 MMOL/ML IV SOLN

[Series 3: T1 · axial · left · 3.0mm · 0.62mm/px · z∈[-62,+83]mm · 5 of 42 slices shown (1 of 2)]
[im 1/42]
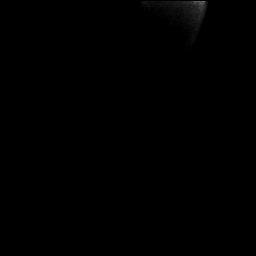
[im 11/42]
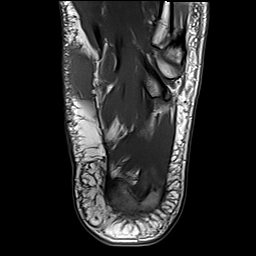
[im 21/42]
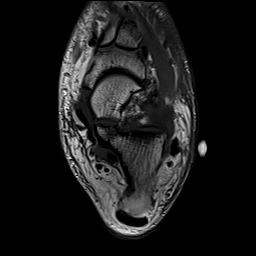
[im 31/42]
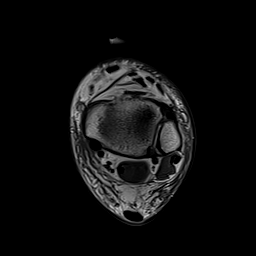
[im 42/42]
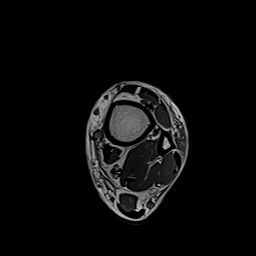

[Series 5: T2 fat-sat · axial · left · 3.0mm · 0.25mm/px · z∈[-62,+83]mm · 5 of 42 slices shown]
[im 1/42]
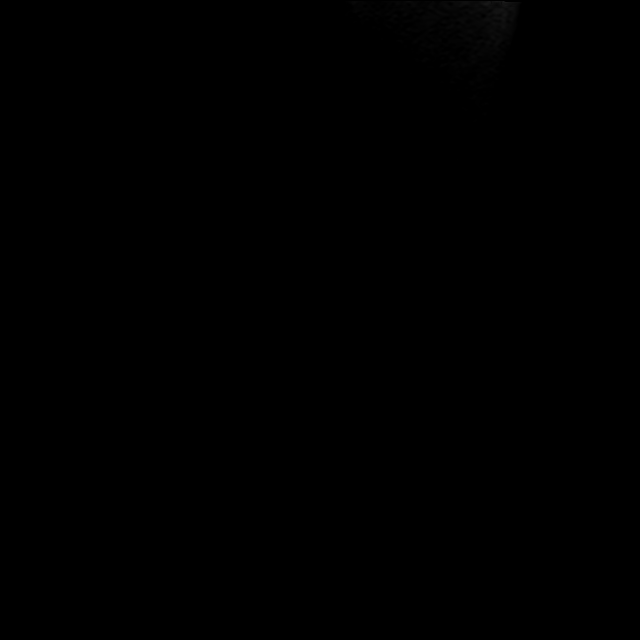
[im 11/42]
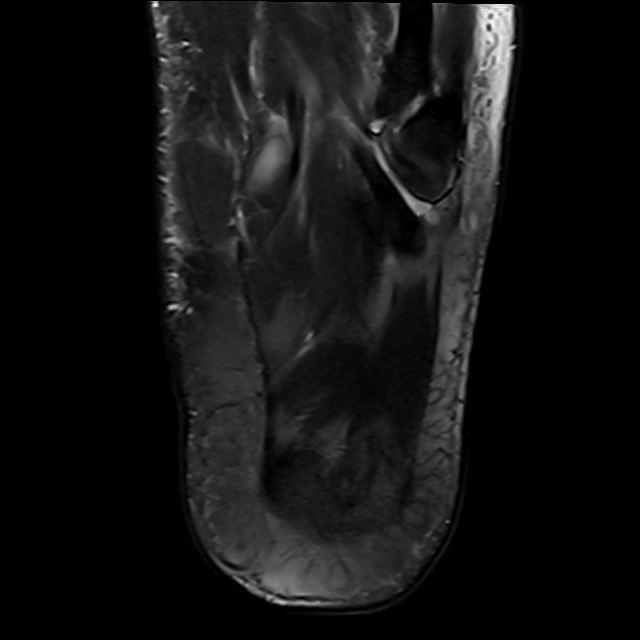
[im 21/42]
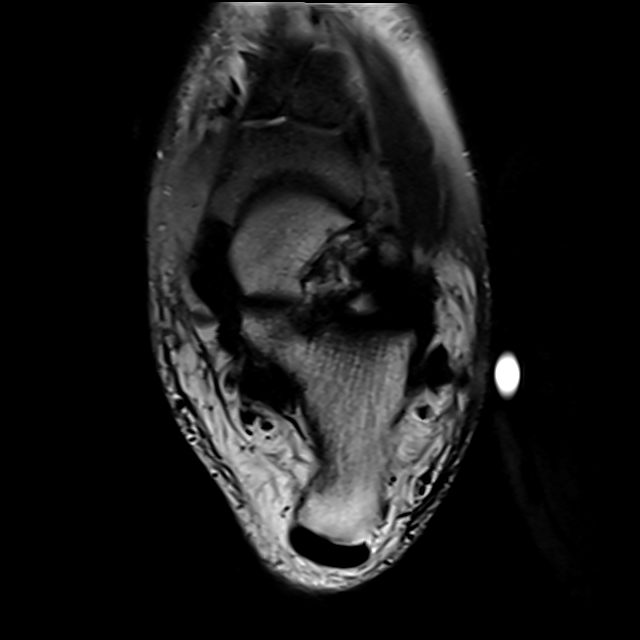
[im 31/42]
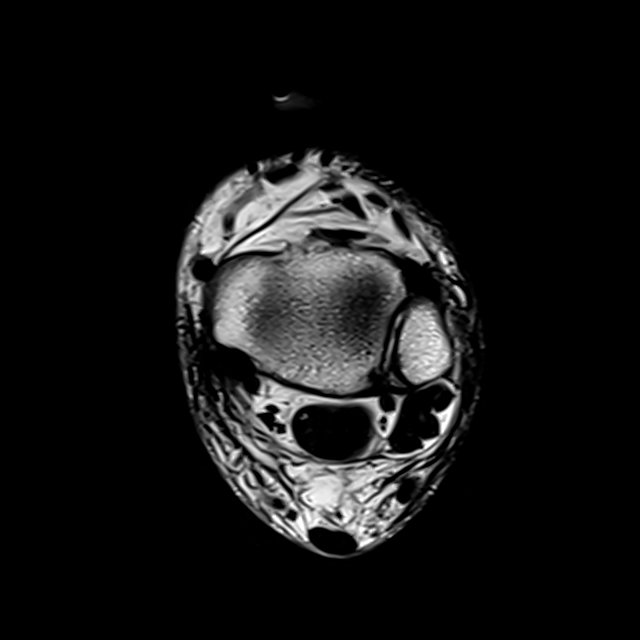
[im 42/42]
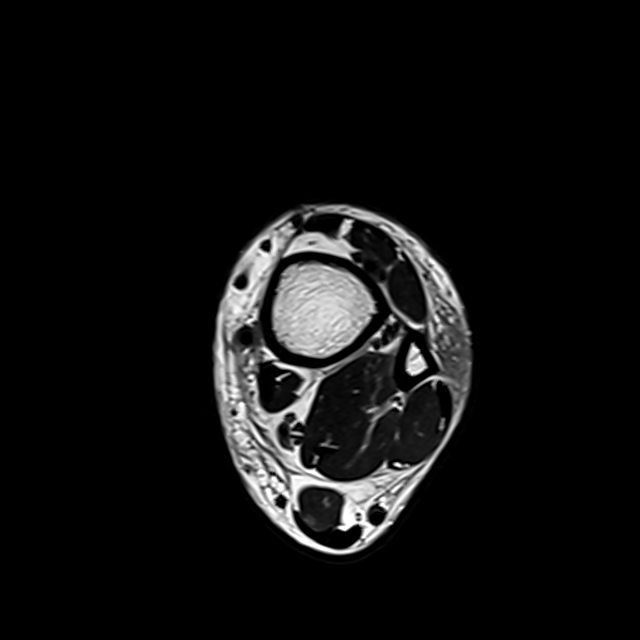

[Series 7: T1 · coronal · left · 3.0mm · 0.31mm/px · 6 of 50 slices shown (2 of 2)]
[im 1/50]
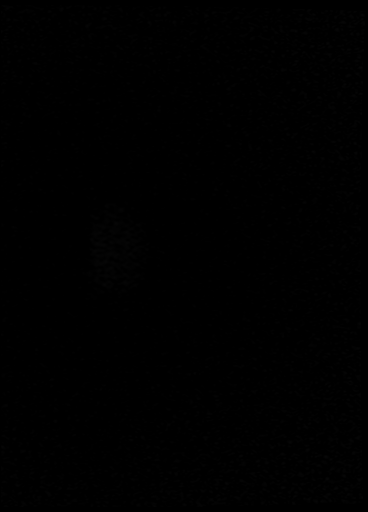
[im 10/50]
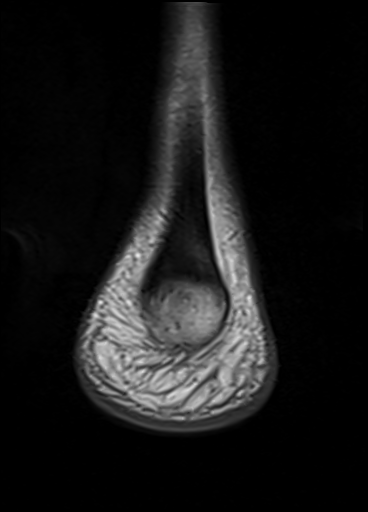
[im 20/50]
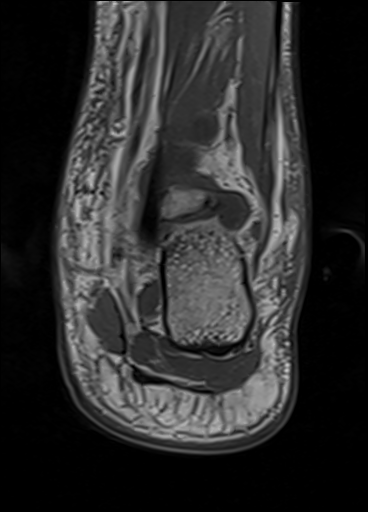
[im 30/50]
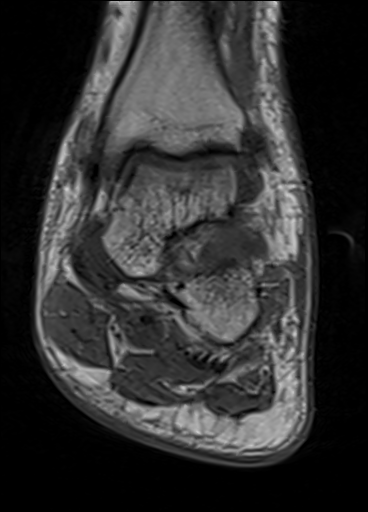
[im 40/50]
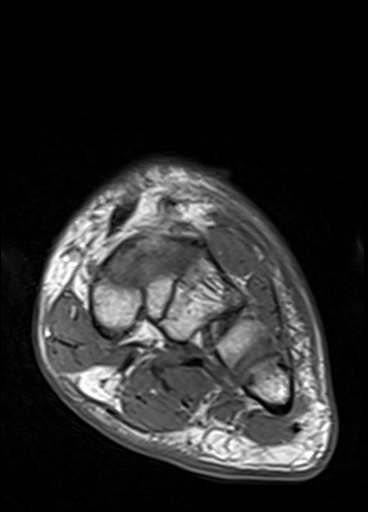
[im 50/50]
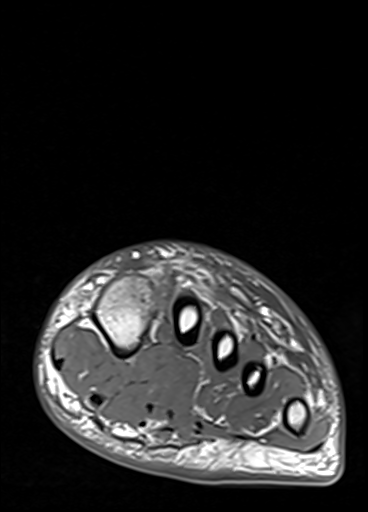

[Series 9: T1 fat-sat · axial · non-contrast · left · 3.0mm · 0.31mm/px · z∈[-66,+79]mm · 5 of 42 slices shown]
[im 1/42]
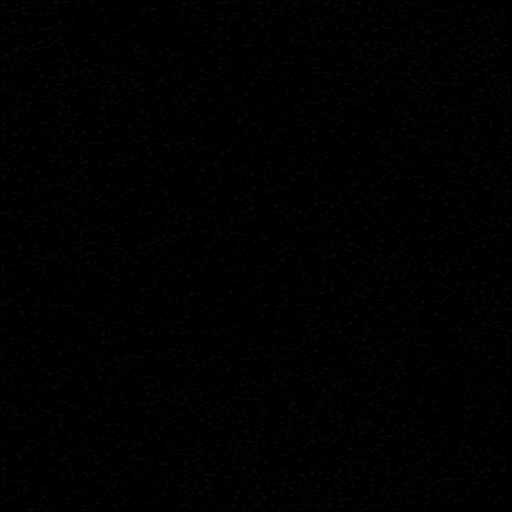
[im 11/42]
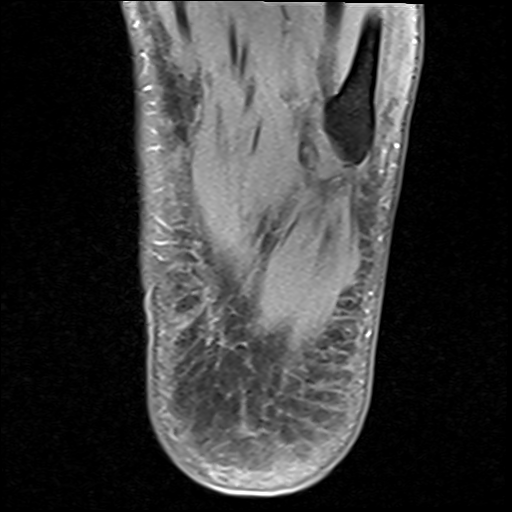
[im 21/42]
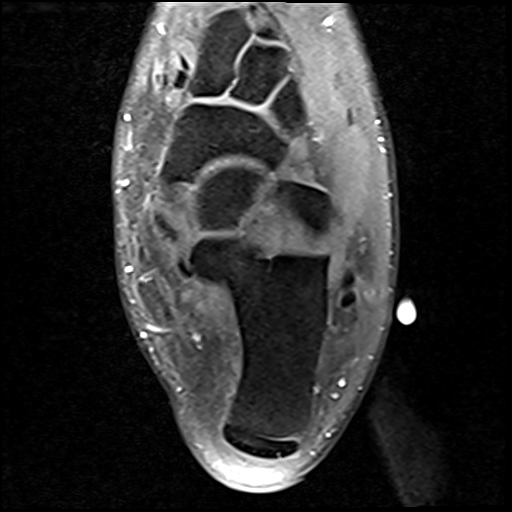
[im 31/42]
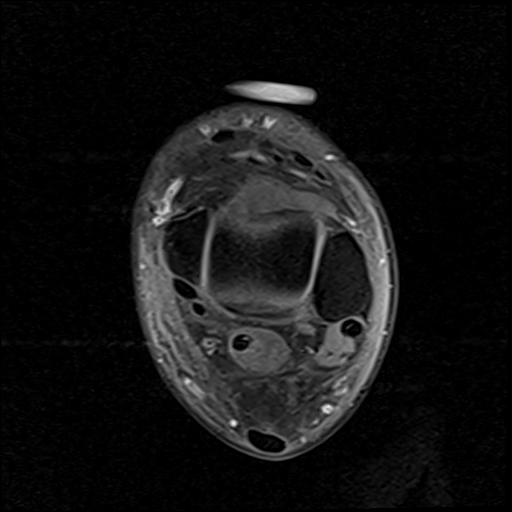
[im 42/42]
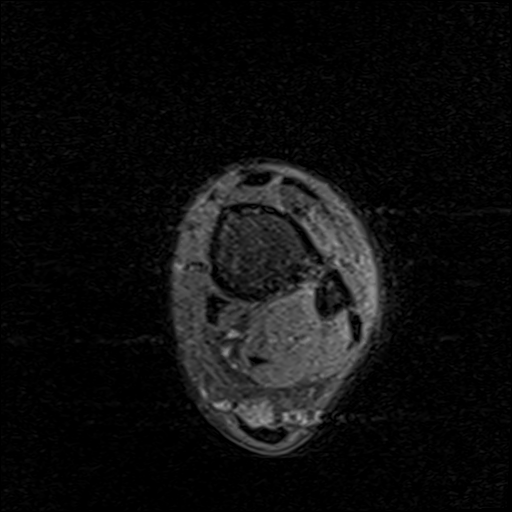

[21 of 40 positions shown; findings below may reference images not displayed]

FINDINGS: TENDONS

Peroneal: Peroneal longus tendon intact. Peroneal brevis intact.

Posteromedial: Posterior tibial tendon intact. Flexor hallucis
longus tendon intact with prominent fluid in the tendon sheath
extending from the level of the tibiotalar joint to the master knot
of Eki. There is associated synovial enhancement. Flexor digitorum
longus tendon intact.

Anterior: Tibialis anterior tendon intact. Extensor hallucis longus
tendon intact Extensor digitorum longus tendon intact.

Achilles:  Intact.

Plantar Fascia: Intact.

LIGAMENTS

Lateral: Anterior talofibular ligament intact. Calcaneofibular
ligament intact. Posterior talofibular ligament intact. Anterior and
posterior tibiofibular ligaments intact.

Medial: Deltoid ligament intact. Spring ligament intact.

CARTILAGE

Ankle Joint: Moderate joint effusion with synovial enhancement.
Normal ankle mortise. No chondral defect.

Subtalar Joints/Sinus Tarsi: Normal subtalar joints. Moderate
posterior subtalar joint effusion with synovial enhancement. Normal
sinus tarsi.

Bones: No marrow signal abnormality. No fracture or dislocation. Os
trigonum.

Soft Tissue: Mild diffuse soft tissue swelling. No soft tissue mass
or fluid collection.
IMPRESSION: IMPRESSION
1. Moderate tibiotalar and posterior subtalar joint effusions with
synovial enhancement, concerning for septic arthritis.
2. Infectious flexor hallucis longus tenosynovitis extending from
the level of the tibiotalar joint to the master knot of Eki.
3. No osteomyelitis or abscess.
